# Patient Record
Sex: Female | Born: 1960 | Race: White | Hispanic: No | Marital: Married | Smoking: Former smoker
Health system: Southern US, Community
[De-identification: ages and names within clinical notes are randomized; demographics above are authoritative.]

## PROBLEM LIST (undated history)

## (undated) DIAGNOSIS — F419 Anxiety disorder, unspecified: Secondary | ICD-10-CM

## (undated) DIAGNOSIS — T7840XA Allergy, unspecified, initial encounter: Secondary | ICD-10-CM

## (undated) DIAGNOSIS — N201 Calculus of ureter: Secondary | ICD-10-CM

## (undated) DIAGNOSIS — Z87442 Personal history of urinary calculi: Secondary | ICD-10-CM

## (undated) DIAGNOSIS — F32A Depression, unspecified: Secondary | ICD-10-CM

## (undated) DIAGNOSIS — F329 Major depressive disorder, single episode, unspecified: Secondary | ICD-10-CM

## (undated) DIAGNOSIS — IMO0002 Reserved for concepts with insufficient information to code with codable children: Secondary | ICD-10-CM

## (undated) HISTORY — DX: Reserved for concepts with insufficient information to code with codable children: IMO0002

## (undated) HISTORY — PX: TUBAL LIGATION: SHX77

## (undated) HISTORY — PX: ABDOMINAL HYSTERECTOMY: SHX81

## (undated) HISTORY — DX: Allergy, unspecified, initial encounter: T78.40XA

## (undated) HISTORY — PX: CHOLECYSTECTOMY: SHX55

## (undated) HISTORY — DX: Calculus of ureter: N20.1

---

## 1999-12-24 ENCOUNTER — Emergency Department (HOSPITAL_COMMUNITY): Admission: EM | Admit: 1999-12-24 | Discharge: 1999-12-24 | Payer: Self-pay | Admitting: Emergency Medicine

## 2001-09-13 ENCOUNTER — Other Ambulatory Visit: Admission: RE | Admit: 2001-09-13 | Discharge: 2001-09-13 | Payer: Self-pay | Admitting: Obstetrics and Gynecology

## 2003-01-26 ENCOUNTER — Other Ambulatory Visit: Admission: RE | Admit: 2003-01-26 | Discharge: 2003-01-26 | Payer: Self-pay | Admitting: Obstetrics and Gynecology

## 2003-05-01 ENCOUNTER — Encounter: Admission: RE | Admit: 2003-05-01 | Discharge: 2003-05-01 | Payer: Self-pay | Admitting: Family Medicine

## 2003-09-25 ENCOUNTER — Encounter: Admission: RE | Admit: 2003-09-25 | Discharge: 2003-09-25 | Payer: Self-pay | Admitting: Family Medicine

## 2004-05-28 ENCOUNTER — Encounter: Admission: RE | Admit: 2004-05-28 | Discharge: 2004-05-28 | Payer: Self-pay | Admitting: Family Medicine

## 2004-07-17 ENCOUNTER — Other Ambulatory Visit: Admission: RE | Admit: 2004-07-17 | Discharge: 2004-07-17 | Payer: Self-pay | Admitting: Obstetrics and Gynecology

## 2004-08-15 ENCOUNTER — Encounter (INDEPENDENT_AMBULATORY_CARE_PROVIDER_SITE_OTHER): Payer: Self-pay | Admitting: Specialist

## 2004-08-15 ENCOUNTER — Observation Stay (HOSPITAL_COMMUNITY): Admission: RE | Admit: 2004-08-15 | Discharge: 2004-08-16 | Payer: Self-pay | Admitting: Obstetrics and Gynecology

## 2008-10-23 ENCOUNTER — Encounter: Admission: RE | Admit: 2008-10-23 | Discharge: 2008-10-23 | Payer: Self-pay | Admitting: Emergency Medicine

## 2009-06-06 ENCOUNTER — Observation Stay (HOSPITAL_COMMUNITY): Admission: EM | Admit: 2009-06-06 | Discharge: 2009-06-10 | Payer: Self-pay | Admitting: Emergency Medicine

## 2009-06-08 ENCOUNTER — Encounter (INDEPENDENT_AMBULATORY_CARE_PROVIDER_SITE_OTHER): Payer: Self-pay | Admitting: Internal Medicine

## 2010-04-11 ENCOUNTER — Encounter: Admission: RE | Admit: 2010-04-11 | Discharge: 2010-04-11 | Payer: Self-pay | Admitting: Internal Medicine

## 2010-04-23 ENCOUNTER — Ambulatory Visit (HOSPITAL_COMMUNITY): Admission: RE | Admit: 2010-04-23 | Discharge: 2010-04-24 | Payer: Self-pay | Admitting: General Surgery

## 2010-04-23 ENCOUNTER — Encounter (INDEPENDENT_AMBULATORY_CARE_PROVIDER_SITE_OTHER): Payer: Self-pay | Admitting: General Surgery

## 2010-07-20 ENCOUNTER — Encounter: Payer: Self-pay | Admitting: Family Medicine

## 2010-09-11 LAB — CBC
HCT: 34.1 % — ABNORMAL LOW (ref 36.0–46.0)
HCT: 34.3 % — ABNORMAL LOW (ref 36.0–46.0)
HCT: 38.1 % (ref 36.0–46.0)
Hemoglobin: 11.2 g/dL — ABNORMAL LOW (ref 12.0–15.0)
Hemoglobin: 11.3 g/dL — ABNORMAL LOW (ref 12.0–15.0)
Hemoglobin: 12.5 g/dL (ref 12.0–15.0)
MCH: 28.7 pg (ref 26.0–34.0)
MCH: 29.1 pg (ref 26.0–34.0)
MCH: 29.5 pg (ref 26.0–34.0)
MCHC: 32.7 g/dL (ref 30.0–36.0)
MCHC: 32.8 g/dL (ref 30.0–36.0)
MCHC: 33.1 g/dL (ref 30.0–36.0)
MCV: 87.6 fL (ref 78.0–100.0)
MCV: 89 fL (ref 78.0–100.0)
MCV: 89.1 fL (ref 78.0–100.0)
Platelets: 241 10*3/uL (ref 150–400)
Platelets: 245 10*3/uL (ref 150–400)
Platelets: 328 10*3/uL (ref 150–400)
RBC: 3.83 MIL/uL — ABNORMAL LOW (ref 3.87–5.11)
RBC: 3.85 MIL/uL — ABNORMAL LOW (ref 3.87–5.11)
RBC: 4.35 MIL/uL (ref 3.87–5.11)
RDW: 14.4 % (ref 11.5–15.5)
RDW: 14.4 % (ref 11.5–15.5)
RDW: 14.5 % (ref 11.5–15.5)
WBC: 12.6 10*3/uL — ABNORMAL HIGH (ref 4.0–10.5)
WBC: 8.6 10*3/uL (ref 4.0–10.5)
WBC: 9.4 10*3/uL (ref 4.0–10.5)

## 2010-09-11 LAB — COMPREHENSIVE METABOLIC PANEL
ALT: 25 U/L (ref 0–35)
ALT: 32 U/L (ref 0–35)
AST: 24 U/L (ref 0–37)
AST: 49 U/L — ABNORMAL HIGH (ref 0–37)
Albumin: 2.5 g/dL — ABNORMAL LOW (ref 3.5–5.2)
Albumin: 3.7 g/dL (ref 3.5–5.2)
Alkaline Phosphatase: 62 U/L (ref 39–117)
Alkaline Phosphatase: 80 U/L (ref 39–117)
BUN: 5 mg/dL — ABNORMAL LOW (ref 6–23)
BUN: 6 mg/dL (ref 6–23)
CO2: 23 mEq/L (ref 19–32)
CO2: 27 mEq/L (ref 19–32)
Calcium: 7.5 mg/dL — ABNORMAL LOW (ref 8.4–10.5)
Calcium: 9.4 mg/dL (ref 8.4–10.5)
Chloride: 101 mEq/L (ref 96–112)
Chloride: 102 mEq/L (ref 96–112)
Creatinine, Ser: 0.73 mg/dL (ref 0.4–1.2)
Creatinine, Ser: 0.84 mg/dL (ref 0.4–1.2)
GFR calc Af Amer: >60 mL/min (ref >60)
GFR calc Af Amer: >60 mL/min (ref >60)
GFR calc non Af Amer: >60 mL/min (ref >60)
GFR calc non Af Amer: >60 mL/min (ref >60)
Glucose, Bld: 103 mg/dL — ABNORMAL HIGH (ref 70–99)
Glucose, Bld: 92 mg/dL (ref 70–99)
Potassium: 3.6 mEq/L (ref 3.5–5.1)
Potassium: 4.6 mEq/L (ref 3.5–5.1)
Sodium: 130 mEq/L — ABNORMAL LOW (ref 135–145)
Sodium: 135 mEq/L (ref 135–145)
Total Bilirubin: 0.2 mg/dL — ABNORMAL LOW (ref 0.3–1.2)
Total Bilirubin: 0.7 mg/dL (ref 0.3–1.2)
Total Protein: 5.2 g/dL — ABNORMAL LOW (ref 6.0–8.3)
Total Protein: 6.8 g/dL (ref 6.0–8.3)

## 2010-09-11 LAB — HEPATIC FUNCTION PANEL
ALT: 52 U/L — ABNORMAL HIGH (ref 0–35)
AST: 89 U/L — ABNORMAL HIGH (ref 0–37)
Albumin: 2.5 g/dL — ABNORMAL LOW (ref 3.5–5.2)
Alkaline Phosphatase: 86 U/L (ref 39–117)
Bilirubin, Direct: 0.7 mg/dL — ABNORMAL HIGH (ref 0.0–0.3)
Indirect Bilirubin: 1 mg/dL — ABNORMAL HIGH (ref 0.3–0.9)
Total Bilirubin: 1.7 mg/dL — ABNORMAL HIGH (ref 0.3–1.2)
Total Protein: 5.2 g/dL — ABNORMAL LOW (ref 6.0–8.3)

## 2010-09-11 LAB — BASIC METABOLIC PANEL
BUN: 5 mg/dL — ABNORMAL LOW (ref 6–23)
CO2: 27 mEq/L (ref 19–32)
Calcium: 7.7 mg/dL — ABNORMAL LOW (ref 8.4–10.5)
Chloride: 105 mEq/L (ref 96–112)
Creatinine, Ser: 0.85 mg/dL (ref 0.4–1.2)
GFR calc Af Amer: >60 mL/min (ref >60)
GFR calc non Af Amer: >60 mL/min (ref >60)
Glucose, Bld: 97 mg/dL (ref 70–99)
Potassium: 3.9 mEq/L (ref 3.5–5.1)
Sodium: 136 mEq/L (ref 135–145)

## 2010-09-11 LAB — SURGICAL PCR SCREEN
MRSA, PCR: NEGATIVE
Staphylococcus aureus: NEGATIVE

## 2010-10-01 LAB — CBC
HCT: 30.8 % — ABNORMAL LOW (ref 36.0–46.0)
HCT: 44.5 % (ref 36.0–46.0)
Hemoglobin: 10.2 g/dL — ABNORMAL LOW (ref 12.0–15.0)
Hemoglobin: 14.9 g/dL (ref 12.0–15.0)
MCHC: 33 g/dL (ref 30.0–36.0)
MCHC: 33.2 g/dL (ref 30.0–36.0)
MCHC: 33.5 g/dL (ref 30.0–36.0)
MCV: 90.6 fL (ref 78.0–100.0)
MCV: 90.9 fL (ref 78.0–100.0)
MCV: 91.4 fL (ref 78.0–100.0)
Platelets: 339 10*3/uL (ref 150–400)
RBC: 3.37 MIL/uL — ABNORMAL LOW (ref 3.87–5.11)
RBC: 4.91 MIL/uL (ref 3.87–5.11)
RDW: 14.5 % (ref 11.5–15.5)
RDW: 14.7 % (ref 11.5–15.5)
WBC: 12.2 10*3/uL — ABNORMAL HIGH (ref 4.0–10.5)
WBC: 6 10*3/uL (ref 4.0–10.5)

## 2010-10-01 LAB — LIPASE, BLOOD: Lipase: 16 U/L (ref 11–59)

## 2010-10-01 LAB — DIFFERENTIAL
Basophils Absolute: 0.4 10*3/uL — ABNORMAL HIGH (ref 0.0–0.1)
Basophils Relative: 3 % — ABNORMAL HIGH (ref 0–1)
Eosinophils Absolute: 0.1 10*3/uL (ref 0.0–0.7)
Eosinophils Absolute: 0.2 10*3/uL (ref 0.0–0.7)
Eosinophils Relative: 1 % (ref 0–5)
Eosinophils Relative: 2 % (ref 0–5)
Lymphocytes Relative: 25 % (ref 12–46)
Lymphocytes Relative: 36 % (ref 12–46)
Lymphs Abs: 2.5 10*3/uL (ref 0.7–4.0)
Lymphs Abs: 3 10*3/uL (ref 0.7–4.0)
Monocytes Absolute: 0.6 10*3/uL (ref 0.1–1.0)
Monocytes Relative: 5 % (ref 3–12)
Monocytes Relative: 7 % (ref 3–12)
Neutro Abs: 8.1 10*3/uL — ABNORMAL HIGH (ref 1.7–7.7)
Neutrophils Relative %: 54 % (ref 43–77)
Neutrophils Relative %: 66 % (ref 43–77)

## 2010-10-01 LAB — URINALYSIS, ROUTINE W REFLEX MICROSCOPIC
Glucose, UA: NEGATIVE mg/dL
Ketones, ur: >80 mg/dL — AB
Leukocytes, UA: NEGATIVE
Nitrite: NEGATIVE
Protein, ur: NEGATIVE mg/dL
Specific Gravity, Urine: 1.026 (ref 1.005–1.030)
Urobilinogen, UA: 0.2 mg/dL (ref 0.0–1.0)
pH: 6 (ref 5.0–8.0)

## 2010-10-01 LAB — BASIC METABOLIC PANEL
BUN: <1 mg/dL — ABNORMAL LOW (ref 6–23)
CO2: 24 mEq/L (ref 19–32)
CO2: 28 mEq/L (ref 19–32)
Calcium: 8 mg/dL — ABNORMAL LOW (ref 8.4–10.5)
Chloride: 108 mEq/L (ref 96–112)
Chloride: 109 mEq/L (ref 96–112)
Creatinine, Ser: 0.63 mg/dL (ref 0.4–1.2)
GFR calc Af Amer: >60 mL/min (ref >60)
Glucose, Bld: 95 mg/dL (ref 70–99)
Potassium: 3.1 mEq/L — ABNORMAL LOW (ref 3.5–5.1)

## 2010-10-01 LAB — COMPREHENSIVE METABOLIC PANEL
ALT: 13 U/L (ref 0–35)
ALT: 18 U/L (ref 0–35)
AST: 15 U/L (ref 0–37)
AST: 22 U/L (ref 0–37)
Albumin: 3.8 g/dL (ref 3.5–5.2)
Alkaline Phosphatase: 66 U/L (ref 39–117)
BUN: 8 mg/dL (ref 6–23)
CO2: 24 mEq/L (ref 19–32)
CO2: 27 mEq/L (ref 19–32)
Calcium: 8.4 mg/dL (ref 8.4–10.5)
Calcium: 9 mg/dL (ref 8.4–10.5)
Chloride: 104 mEq/L (ref 96–112)
Creatinine, Ser: 0.69 mg/dL (ref 0.4–1.2)
Creatinine, Ser: 0.71 mg/dL (ref 0.4–1.2)
GFR calc Af Amer: >60 mL/min (ref >60)
GFR calc Af Amer: >60 mL/min (ref >60)
GFR calc non Af Amer: >60 mL/min (ref >60)
GFR calc non Af Amer: >60 mL/min (ref >60)
Glucose, Bld: 101 mg/dL — ABNORMAL HIGH (ref 70–99)
Potassium: 3.4 mEq/L — ABNORMAL LOW (ref 3.5–5.1)
Sodium: 135 mEq/L (ref 135–145)
Sodium: 136 mEq/L (ref 135–145)
Total Bilirubin: 0.6 mg/dL (ref 0.3–1.2)
Total Protein: 5.8 g/dL — ABNORMAL LOW (ref 6.0–8.3)
Total Protein: 7.5 g/dL (ref 6.0–8.3)

## 2010-10-01 LAB — GLUCOSE, CAPILLARY: Glucose-Capillary: 104 mg/dL — ABNORMAL HIGH (ref 70–99)

## 2010-10-01 LAB — URINE MICROSCOPIC-ADD ON

## 2010-10-01 LAB — MAGNESIUM: Magnesium: 2 mg/dL (ref 1.5–2.5)

## 2010-11-15 NOTE — Discharge Summary (Signed)
Karen Rhodes, Karen Rhodes NO.:  [id number]   MEDICAL RECORD NO.:  [id number]          PATIENT TYPE:  OBV   LOCATION:  9319                          FACILITY:  [facility]   PHYSICIAN:  Juluis Mire, M.D.   DATE OF BIRTH:  [birth year]   DATE OF ADMISSION:  08/15/2004  DATE OF DISCHARGE:                                 DISCHARGE SUMMARY   ADMITTING DIAGNOSIS:  Uterine adenomyosis, endometriosis.   DISCHARGE DIAGNOSIS:  Uterine adenomyosis, endometriosis.   OPERATIVE PROCEDURE:  Laparoscopic-assisted vaginal hysterectomy with left  salpingo-oophorectomy.   For complete history and physical please see dictated note.   COURSE IN THE HOSPITAL:  The patient underwent the above-noted surgery.  Pelvic endometriosis was noted. Final pathology is pending. Postoperatively,  her hemoglobin was 10.1. She was seen on her first postoperative day, did  have some abdominal pain and nausea that resolved with observation, was  discharged home later on her postoperative day #1. At that time she was  afebrile with stable vital signs. Abdomen was soft, bowel sounds were  active. Incision was clear. She had no active vaginal bleeding. She was  voiding without difficulty.   COMPLICATIONS:  None encountered during her stay in the hospital. The  patient was discharged home in stable condition.   DISPOSITION:  Routine postoperative instructions and orders given. She is to  avoid heavy lifting, vaginal entrance, or driving a car. Given Percocet as  needed for pain as well as Phenergan for nausea. The office will call her  and she will arrange follow-up in 1 week. She is to call with  nausea/vomiting, increasing abdominal pain, active bleeding, or signs of  fever.      JSM/MEDQ  D:  08/16/2004  T:  08/16/2004  Job:  [id number]

## 2010-11-15 NOTE — Op Note (Signed)
NAMEMARQUISA, SALIH NO.:  [id number]   MEDICAL RECORD NO.:  [id number]          PATIENT TYPE:  OBV   LOCATION:  9399                          [facility]ILITY[facility]  [facility]   PHYSICIAN:  Juluis Mire, M.D.   DATE OF BIRTH:  [birth year]   DATE OF PROCEDURE:  08/15/2004  DATE OF DISCHARGE:                                 OPERATIVE REPORT   PREOPERATIVE DIAGNOSIS:  Pelvic pain and menorrhagia secondary to uterine  adenomyosis and pelvic endometriosis.   POSTOPERATIVE DIAGNOSIS:  Pelvic pain and menorrhagia secondary to uterine  adenomyosis and pelvic endometriosis.   OPERATIVE PROCEDURE:  Laparoscopically-assisted vaginal hysterectomy with  left salpingo-oophorectomy.   SURGEON:  Juluis Mire, M.D.   ANESTHESIA:  General endotracheal.   ESTIMATED BLOOD LOSS:  200-400 mL.   PACKS AND DRAINS:  None.   INTRAOPERATIVE BLOOD REPLACED:  None.   COMPLICATIONS:  None.   INDICATIONS:  Dictated in the history and physical.   The procedure is as follows:  The patient was taken in the OR and placed in  the supine position.  After a satisfactory level of general endotracheal  anesthesia was obtained, the patient was placed in dorsal lithotomy position  using the Allen stirrups.  The abdomen, perineum and vagina were prepped out  with Betadine.  The bladder was emptied by in-and-out catheterization.  A  Hulka tenaculum was put in place and secured.  The patient was then draped  in a sterile field.  A subumbilical incision was made with the knife.  A  Veress needle was introduced into the abdominal cavity.  The abdomen was  insufflated with approximately 3 L of carbon dioxide. The operating  laparoscope was introduced.  There was no evidence of injury to adjacent  organs.  A 5 mm trocar was put into place in the suprapubic area under  direct visualization.  The uterus was upper limits of normal size.  The left  ovary did have endometriosis involving it.  There were no adhesions.   The  right tube and ovary were unremarkable.  There was endometriosis in the cul-  de-sac.  The adnexa were visualized and noted to be normal.  The upper  abdomen including the liver and tip of the gallbladder were clear.  Next,  using the Gyrus bipolar, the left ovary was elevated.  The ovarian  vasculature was identified.  The ureter was seen well down into the pelvis.  Next the left ovarian vasculature was cauterized, incised using the Gyrus.  The peritoneal attachment of the ovary and tube was cauterized and incised.  The left round ligament was cauterized and incised.  Next the right ovary  was elevated.  The ureter was identified.  The right utero-ovarian pedicle  was cauterized and incised and the right tube and mesosalpinx were  cauterized and incised, and the right round ligament was cauterized and  incised.  She did have some bleeding from the right ovary.  This was brought  under control, again using the Gyrus.  At this point in time we had good  hemostasis.  The decision  was to go vaginally.  The abdomen was deflated of  its carbon dioxide, laparoscope was removed.   The patient's legs were repositioned, the Hulka tenaculum was then removed.  A weighted speculum was placed in the vaginal vault.  Cervix grasped with a  Christella Hartigan tenaculum.  The cul-de-sac was entered sharply.  The uterosacral  ligaments were clamped, cut, and suture ligated with 0 Vicryl.  Reflection  of the vaginal mucosa anteriorly was incised and the bladder was dissected  superiorly.  The paracervical tissue was clamped, cut, and suture ligated  with 0 Vicryl.  The vesicouterine space was identified, entered, retractors  put in place to retract the bladder superiorly.  Using the clamp, cut and  tie technique with suture ligatures of 0 Vicryl, the parametrium was  serially separated from the sides of the uterus.  The uterus was then  flipped.  The remaining pedicles were clamped and cut.  The uterus and left   tube and ovary were passed off the operative field.  The held pedicles were  secured with free ties of 0 Vicryl.  Areas of bleeding along the vaginal  cuff were brought under control with figure-of-eights of 0 Vicryl.  The  vaginal mucosa was then reapproximated in a vertical fashion in the midline  with figure-of-eights of 0 Vicryl.  A Foley was placed to straight drain  with retrieval of an adequate amount of clear urine.  A sponge on a sponge  stick was placed in the vaginal vault.  The weighted speculum was then  removed.   The patient's legs were repositioned, the abdomen was reinflated with carbon  dioxide.  The laparoscope was then reintroduced.  Visualization revealed  some oozing at the vaginal cuff, brought under control using the Gyrus.  We  thoroughly irrigated the pelvis.  We had good hemostasis at all pedicles.  At this point in time the abdomen was deflated of its carbon dioxide, all  trocars removed.  The subumbilical incision closed with interrupted  subcuticulars of 4-0 Vicryl, the suprapubic incision was closed with  Dermabond, the sponge on a sponge stick was removed from the vaginal vault.  The patient was taken out of the dorsal lithotomy position and once alert  and extubated, transferred to the recovery room in good condition.  Sponge,  instrument and needle count reported as correct by the circulating nurse x2.  The Foley catheter remained clear at the time of closure.      JSM/MEDQ  D:  08/15/2004  T:  08/15/2004  Job:  [id number]

## 2010-11-15 NOTE — H&P (Signed)
NAMELARUTH, HANGER NO.:  [id number]   MEDICAL RECORD NO.:  [id number]          PATIENT TYPE:  AMB   LOCATION:  SDC                           [facility]:  [facility]   PHYSICIAN:  Juluis Mire, M.D.   DATE OF BIRTH:  05-29-1961   DATE OF ADMISSION:  DATE OF DISCHARGE:                                HISTORY & PHYSICAL   The patient is a 50 year old gravida 2, para 2, married white female who  presents for a laparoscopically-assisted vaginal hysterectomy with left  salpingo-oophorectomy.   In relation to the present admission, her cycles are regular at the present  time.  She has five to six days of flow, changing pads and tampons every two  hours with clots and increasing pain.  The pain is very prominent on the  left side.  She also has pain on the left side during intercourse during  deep penetration that radiates to the back at times.  She underwent an  ultrasound evaluation, which was highly suggestive of adenomyosis.  She did  have a left ovarian cyst that basically resolved on follow-up, therefore was  functional in nature.  Because of persistent pain, abnormal bleeding and  pain with intercourse, we have discussed options.  This would include  laparoscopic evaluation versus more aggressive therapy in the form of  laparoscopically-assisted vaginal hysterectomy and left salpingo-  oophorectomy.  The patient is in favor of this procedure and is admitted at  the present time.   In terms of allergies, the patient is allergic to CODEINE.   MEDICATIONS:  Cymbalta, Prozac and Ambien.   PAST MEDICAL HISTORY:  Usual childhood diseases without any significant  sequelae.  No previous surgical history except for bilateral tubal ligation  in 1996.   PAST OBSTETRICAL HISTORY:  She has had two vaginal deliveries.   FAMILY HISTORY:  Noncontributory.   SOCIAL HISTORY:  No tobacco or alcohol use.   REVIEW OF SYSTEMS:  Noncontributory.   PHYSICAL EXAMINATION:  VITAL  SIGNS:  The patient is afebrile with stable  vital signs.  HEENT:  The patient was normocephalic.  Pupils equal, round, and reactive to  light and accommodation.  Extraocular movements were intact.  Sclerae and  conjunctivae clear.  Oropharynx clear.  NECK:  Without thyromegaly.  BREASTS:  No discrete masses.  CHEST:  Lungs clear.  CARDIAC:  Regular rhythm and rate without murmurs or gallops.  ABDOMEN:  Benign, no masses, organomegaly or tenderness.  PELVIC:  Normal external genitalia.  Vaginal mucosa clear.  Cervix  unremarkable.  Uterus upper limits of normal size consistent with  adenomyosis.  Adnexa unremarkable.  Rectovaginal exam is clear.  EXTREMITIES:  Trace edema.  NEUROLOGIC:  Grossly within normal limits.   IMPRESSION:  Menorrhagia and dysmenorrhea secondary to uterine adenomyosis  versus endometriosis.   PLAN:  At the present time the patient has chosen to undergo  laparoscopically-assisted vaginal hysterectomy with left salpingo-  oophorectomy.  The right ovary will be left in place.  We have discussed  other options, including the possibility of laparoscopy, hysteroscopy and  endometrial ablation.  We have also discussed the use of the Mirena IUD.  We  also discussed the use of birth control pills.  Again, the patient has  chosen hysterectomy.  The risks of surgery have been discussed, including  the risks of infection; the risk of hemorrhage that could require  transfusion and the risk of AIDS or hepatitis; the risk of injury to  adjacent organs including bladder, bowel or ureters that could require  further exploratory surgery; the risk of deep venous thrombosis and  pulmonary embolus.  The patient expressed an understanding of the  indications, risks and options.      JSM/MEDQ  D:  08/15/2004  T:  08/15/2004  Job:  [id number]

## 2012-01-20 ENCOUNTER — Encounter (HOSPITAL_COMMUNITY): Payer: Self-pay | Admitting: *Deleted

## 2012-01-20 ENCOUNTER — Emergency Department (HOSPITAL_COMMUNITY)
Admission: EM | Admit: 2012-01-20 | Discharge: 2012-01-21 | Disposition: A | Discharge status: Home / Self Care | Payer: Self-pay | Attending: Emergency Medicine | Admitting: Emergency Medicine

## 2012-01-20 DIAGNOSIS — F329 Major depressive disorder, single episode, unspecified: Secondary | ICD-10-CM | POA: Insufficient documentation

## 2012-01-20 DIAGNOSIS — Z9089 Acquired absence of other organs: Secondary | ICD-10-CM | POA: Insufficient documentation

## 2012-01-20 DIAGNOSIS — F17200 Nicotine dependence, unspecified, uncomplicated: Secondary | ICD-10-CM | POA: Insufficient documentation

## 2012-01-20 DIAGNOSIS — F411 Generalized anxiety disorder: Secondary | ICD-10-CM | POA: Insufficient documentation

## 2012-01-20 DIAGNOSIS — F3289 Other specified depressive episodes: Secondary | ICD-10-CM | POA: Insufficient documentation

## 2012-01-20 HISTORY — DX: Depression, unspecified: F32.A

## 2012-01-20 HISTORY — DX: Major depressive disorder, single episode, unspecified: F32.9

## 2012-01-20 HISTORY — DX: Anxiety disorder, unspecified: F41.9

## 2012-01-20 LAB — BASIC METABOLIC PANEL
Calcium: 9.3 mg/dL (ref 8.4–10.5)
GFR calc Af Amer: >90 mL/min (ref >90)
GFR calc non Af Amer: >90 mL/min (ref >90)
Glucose, Bld: 112 mg/dL — ABNORMAL HIGH (ref 70–99)
Sodium: 136 mEq/L (ref 135–145)

## 2012-01-20 LAB — CBC WITH DIFFERENTIAL/PLATELET
Basophils Relative: 1 % (ref 0–1)
Eosinophils Absolute: 0.2 10*3/uL (ref 0.0–0.7)
Eosinophils Relative: 2 % (ref 0–5)
Lymphs Abs: 3.4 10*3/uL (ref 0.7–4.0)
MCH: 29.3 pg (ref 26.0–34.0)
MCHC: 33.4 g/dL (ref 30.0–36.0)
MCV: 87.8 fL (ref 78.0–100.0)
Platelets: 314 10*3/uL (ref 150–400)
RBC: 4.43 MIL/uL (ref 3.87–5.11)
RDW: 13.8 % (ref 11.5–15.5)

## 2012-01-20 LAB — RAPID URINE DRUG SCREEN, HOSP PERFORMED
Barbiturates: NOT DETECTED
Opiates: NOT DETECTED
Tetrahydrocannabinol: NOT DETECTED

## 2012-01-20 MED ORDER — ONDANSETRON HCL 4 MG PO TABS: 4.0000 mg | ORAL_TABLET | Freq: Three times a day (TID) | ORAL | Status: DC | PRN

## 2012-01-20 MED ORDER — LORAZEPAM 1 MG PO TABS: 1.0000 mg | ORAL_TABLET | Freq: Three times a day (TID) | ORAL | Status: DC | PRN

## 2012-01-20 MED ORDER — ZOLPIDEM TARTRATE 5 MG PO TABS: 5.0000 mg | ORAL_TABLET | Freq: Every evening | ORAL | Status: DC | PRN

## 2012-01-20 MED ORDER — IBUPROFEN 600 MG PO TABS: 600.0000 mg | ORAL_TABLET | Freq: Three times a day (TID) | ORAL | Status: DC | PRN

## 2012-01-20 MED ORDER — ACETAMINOPHEN 325 MG PO TABS: 650.0000 mg | ORAL_TABLET | ORAL | Status: DC | PRN

## 2012-01-20 MED ORDER — NICOTINE 21 MG/24HR TD PT24
21.0000 mg | MEDICATED_PATCH | Freq: Every day | TRANSDERMAL | Status: DC
  Administered 2012-01-20: 21 mg via TRANSDERMAL
  Filled 2012-01-20: qty 1

## 2012-01-20 MED ORDER — ALUM & MAG HYDROXIDE-SIMETH 200-200-20 MG/5ML PO SUSP: 30.0000 mL | ORAL | Status: DC | PRN

## 2012-01-20 NOTE — ED Notes (Signed)
Pt's Husband: Federica Allport: [phone]

## 2012-01-20 NOTE — ED Notes (Signed)
ZOX:[id number]<AV> Expected date:01/20/12<BR> Expected time: 1:37 PM<BR> Means of arrival:Ambulance<BR> Comments:<BR> overdose

## 2012-01-20 NOTE — ED Notes (Signed)
MD with patience

## 2012-01-20 NOTE — ED Notes (Signed)
Pt items are as followed gray leggy pants and colored blouse and white underwear and black flip flop items are under  Nurses station, three rings and earring studs

## 2012-01-20 NOTE — ED Notes (Signed)
Family at bedside. Sitter at bedside.  

## 2012-01-20 NOTE — ED Provider Notes (Signed)
History     CSN: [id number]  Arrival date & time 01/20/12  1342   First MD Initiated Contact with Patient 01/20/12 1348      Chief Complaint  Patient presents with  . Ingestion    (Consider location/radiation/quality/duration/timing/severity/associated sxs/prior treatment) HPI... took lorazepam 1 mg tablets x5 this afternoon.  Patient is not truly suicidal although she is depressed. 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  Severity is moderate to severe. Nothing makes symptoms better or worse  Past Medical History  Diagnosis Date  . Depression   . Anxiety     Past Surgical History  Procedure Date  . Cholecystectomy   . Abdominal hysterectomy     History reviewed. No pertinent family history.  History  Substance Use Topics  . Smoking status: Current Everyday Smoker    Types: Cigarettes  . Smokeless tobacco: Not on file  . Alcohol Use: No    OB History    Grav Para Term Preterm Abortions TAB SAB Ect Mult Living                  Review of Systems  All other systems reviewed and are negative.    Allergies  Penicillins  Home Medications   Current Outpatient Rx  Name Route Sig Dispense Refill  . AMPHETAMINE-DEXTROAMPHETAMINE 20 MG PO TABS Oral Take 20 mg by mouth 3 (three) times daily.    Marland Kitchen VITAMIN D 1000 UNITS PO TABS Oral Take 1,000 Units by mouth daily.    . DULOXETINE HCL 30 MG PO CPEP Oral Take 30 mg by mouth 4 (four) times daily.    Marland Kitchen LORAZEPAM 1 MG PO TABS Oral Take 1 mg by mouth every 6 (six) hours as needed. anxiety      BP 121/71  Pulse 97  Temp 98.2 F (36.8 C) (Oral)  Resp 15  SpO2 96%  Physical Exam  Nursing note and vitals reviewed. Constitutional: She is oriented to person, place, and time. She appears well-developed and well-nourished.  HENT:  Head: Normocephalic and atraumatic.  Eyes: Conjunctivae and EOM are normal. Pupils are equal, round, and reactive to  light.  Neck: Normal range of motion. Neck supple.  Cardiovascular: Normal rate and regular rhythm.   Pulmonary/Chest: Effort normal and breath sounds normal.  Abdominal: Soft. Bowel sounds are normal.  Musculoskeletal: Normal range of motion.  Neurological: She is alert and oriented to person, place, and time.  Skin: Skin is warm and dry.  Psychiatric:       Flat affect, depressed    ED Course  Procedures (including critical care time)   Labs Reviewed  CBC WITH DIFFERENTIAL  BASIC METABOLIC PANEL  ETHANOL  URINE RAPID DRUG SCREEN (HOSP PERFORMED)   No results found.   No diagnosis found.    MDM  Patient is depressed. Nothing she is frankly suicidal. Behavioral health consult        Donnetta Hutching, MD 01/20/12 1440

## 2012-01-20 NOTE — ED Notes (Signed)
MD at bedside. 

## 2012-01-20 NOTE — ED Notes (Signed)
Family at bedside. 
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
## 2012-01-20 NOTE — ED Notes (Signed)
Patients family sts shes had a few suicidial attempts in the last few months, sts they have to take her meds from her at times

## 2012-01-20 NOTE — ED Notes (Addendum)
Per EMS pt in from home reports taking 5-1mg  ativan around 1245 and 1300 today. Pt was threatening suicide to EMS.

## 2012-01-20 NOTE — ED Notes (Signed)
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

## 2012-01-20 NOTE — ED Notes (Signed)
Pt states that she has been overwhelmed with a series of life events that she cannot bear.  She said that she "just can't catch a break."  Also said that she texted her sister about not being to "handle it" and wanted to "end it."  Pt states that she and sister normally joke about this but today her sister knew she was not joking and called.  Pt reports taking 5 -1mg  Ativan and just wanted to go to sleep.  Pt noted to be tearful while giving this account.

## 2012-01-20 NOTE — ED Notes (Signed)
Pt belonging bag at nursing station

## 2012-01-21 NOTE — ED Notes (Signed)
ACT team at bedside.  
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
## 2012-01-22 ENCOUNTER — Telehealth (HOSPITAL_COMMUNITY): Payer: Self-pay | Admitting: Licensed Clinical Social Worker

## 2012-07-10 ENCOUNTER — Ambulatory Visit: Payer: Self-pay | Admitting: Family Medicine

## 2012-07-10 VITALS — BP 112/71 | HR 79 | Temp 97.5°F | Resp 18 | Ht 65.0 in | Wt 151.8 lb

## 2012-07-10 DIAGNOSIS — M545 Low back pain, unspecified: Secondary | ICD-10-CM | POA: Insufficient documentation

## 2012-07-10 DIAGNOSIS — M549 Dorsalgia, unspecified: Secondary | ICD-10-CM

## 2012-07-10 DIAGNOSIS — Z8742 Personal history of other diseases of the female genital tract: Secondary | ICD-10-CM | POA: Insufficient documentation

## 2012-07-10 MED ORDER — OXYCODONE-ACETAMINOPHEN 5-325 MG PO TABS: 1.0000 | ORAL_TABLET | Freq: Three times a day (TID) | ORAL | Status: DC | PRN

## 2012-07-10 MED ORDER — METHYLPREDNISOLONE ACETATE 80 MG/ML IJ SUSP
80.0000 mg | Freq: Once | INTRAMUSCULAR | Status: AC
  Administered 2012-07-10: 80 mg via INTRAMUSCULAR

## 2012-07-10 MED ORDER — CYCLOBENZAPRINE HCL 5 MG PO TABS: 5.0000 mg | ORAL_TABLET | Freq: Every evening | ORAL | Status: DC | PRN

## 2012-07-10 NOTE — Progress Notes (Signed)
This is a 52 y.o.female who has chronic intermittent back pain, most recently over a year ago.  This time the pain has been worsening over a week and is radiating down both legs.   Patient has a past history of low back pain for which she taken pain medicine.  Karen Rhodes denies any urinary symptoms, bowel problems, numbness in the legs, loss of motor power. Karen Rhodes had no fever.  Karen Rhodes has tried tylenol She had to leave work early today.  The pain is associated with nausea. She has used lidocaine patches in the past, but ran out of them  Past Medical History  Diagnosis Date  . Depression   . Anxiety   . Allergy   . Ulcer      Past Surgical History  Procedure Date  . Cholecystectomy   . Abdominal hysterectomy   . Tubal ligation     Objective:  middle-aged female in no acute distress. Blood pressure 112/71, pulse 79, temperature 97.5 F (36.4 C), temperature source Oral, resp. rate 18, height 5\' 5"  (1.651 m), weight 151 lb 12.8 oz (68.856 kg), SpO2 99.00%.Body mass index is 25.26 kg/(m^2). Palpation of the back reveals left lower paraspinal tenderness  Inspection of the back: Reveals nl scoliosis  Straight-leg raising: positive at 45 degrees bilaterally  Motor exam of lower extremity: No abnormal weakness.  Reflexes: Symmetric and normal  Skin exam: normal  Assessment/Plan: Acute lower back pain without acute neurological findings consistent with swollen disc  Plan:     1. Back pain  methylPREDNISolone acetate (DEPO-MEDROL) injection 80 mg, oxyCODONE-acetaminophen (ROXICET) 5-325 MG per tablet, cyclobenzaprine (FLEXERIL) 5 MG tablet  2. H/O endometritis    3. Low back pain

## 2012-07-10 NOTE — Patient Instructions (Addendum)
Sciatica Sciatica is pain, weakness, numbness, or tingling along the path of the sciatic nerve. The nerve starts in the lower back and runs down the back of each leg. The nerve controls the muscles in the lower leg and in the back of the knee, while also providing sensation to the back of the thigh, lower leg, and the sole of your foot. Sciatica is a symptom of another medical condition. For instance, nerve damage or certain conditions, such as a herniated disk or bone spur on the spine, pinch or put pressure on the sciatic nerve. This causes the pain, weakness, or other sensations normally associated with sciatica. Generally, sciatica only affects one side of the body. CAUSES   Herniated or slipped disc.  Degenerative disk disease.  A pain disorder involving the narrow muscle in the buttocks (piriformis syndrome).  Pelvic injury or fracture.  Pregnancy.  Tumor (rare). SYMPTOMS  Symptoms can vary from mild to very severe. The symptoms usually travel from the low back to the buttocks and down the back of the leg. Symptoms can include:  Mild tingling or dull aches in the lower back, leg, or hip.  Numbness in the back of the calf or sole of the foot.  Burning sensations in the lower back, leg, or hip.  Sharp pains in the lower back, leg, or hip.  Leg weakness.  Severe back pain inhibiting movement. These symptoms may get worse with coughing, sneezing, laughing, or prolonged sitting or standing. Also, being overweight may worsen symptoms. DIAGNOSIS  Your caregiver will perform a physical exam to look for common symptoms of sciatica. He or she may ask you to do certain movements or activities that would trigger sciatic nerve pain. Other tests may be performed to find the cause of the sciatica. These may include:  Blood tests.  X-rays.  Imaging tests, such as an MRI or CT scan. TREATMENT  Treatment is directed at the cause of the sciatic pain. Sometimes, treatment is not necessary  and the pain and discomfort goes away on its own. If treatment is needed, your caregiver may suggest:  Over-the-counter medicines to relieve pain.  Prescription medicines, such as anti-inflammatory medicine, muscle relaxants, or narcotics.  Applying heat or ice to the painful area.  Steroid injections to lessen pain, irritation, and inflammation around the nerve.  Reducing activity during periods of pain.  Exercising and stretching to strengthen your abdomen and improve flexibility of your spine. Your caregiver may suggest losing weight if the extra weight makes the back pain worse.  Physical therapy.  Surgery to eliminate what is pressing or pinching the nerve, such as a bone spur or part of a herniated disk. HOME CARE INSTRUCTIONS   Only take over-the-counter or prescription medicines for pain or discomfort as directed by your caregiver.  Apply ice to the affected area for 20 minutes, 3 4 times a day for the first 48 72 hours. Then try heat in the same way.  Exercise, stretch, or perform your usual activities if these do not aggravate your pain.  Attend physical therapy sessions as directed by your caregiver.  Keep all follow-up appointments as directed by your caregiver.  Do not wear high heels or shoes that do not provide proper support.  Check your mattress to see if it is too soft. A firm mattress may lessen your pain and discomfort. SEEK IMMEDIATE MEDICAL CARE IF:   You lose control of your bowel or bladder (incontinence).  You have increasing weakness in the lower back,   pelvis, buttocks, or legs.  You have redness or swelling of your back.  You have a burning sensation when you urinate.  You have pain that gets worse when you lie down or awakens you at night.  Your pain is worse than you have experienced in the past.  Your pain is lasting longer than 4 weeks.  You are suddenly losing weight without reason. MAKE SURE YOU:  Understand these  instructions.  Will watch your condition.  Will get help right away if you are not doing well or get worse. Document Released: 06/10/2001 Document Revised: 12/16/2011 Document Reviewed: 10/26/2011 Exit[employer] Patient Information 2013 [employer].  

## 2012-07-11 ENCOUNTER — Emergency Department (HOSPITAL_COMMUNITY)
Admission: EM | Admit: 2012-07-11 | Discharge: 2012-07-11 | Disposition: A | Discharge status: Home / Self Care | Payer: Self-pay | Attending: Emergency Medicine | Admitting: Emergency Medicine

## 2012-07-11 ENCOUNTER — Encounter (HOSPITAL_COMMUNITY): Payer: Self-pay | Admitting: Emergency Medicine

## 2012-07-11 DIAGNOSIS — Y939 Activity, unspecified: Secondary | ICD-10-CM | POA: Insufficient documentation

## 2012-07-11 DIAGNOSIS — S335XXA Sprain of ligaments of lumbar spine, initial encounter: Secondary | ICD-10-CM | POA: Insufficient documentation

## 2012-07-11 DIAGNOSIS — G8929 Other chronic pain: Secondary | ICD-10-CM | POA: Insufficient documentation

## 2012-07-11 DIAGNOSIS — F329 Major depressive disorder, single episode, unspecified: Secondary | ICD-10-CM | POA: Insufficient documentation

## 2012-07-11 DIAGNOSIS — F411 Generalized anxiety disorder: Secondary | ICD-10-CM | POA: Insufficient documentation

## 2012-07-11 DIAGNOSIS — Y929 Unspecified place or not applicable: Secondary | ICD-10-CM | POA: Insufficient documentation

## 2012-07-11 DIAGNOSIS — X58XXXA Exposure to other specified factors, initial encounter: Secondary | ICD-10-CM | POA: Insufficient documentation

## 2012-07-11 DIAGNOSIS — F17200 Nicotine dependence, unspecified, uncomplicated: Secondary | ICD-10-CM | POA: Insufficient documentation

## 2012-07-11 DIAGNOSIS — M5430 Sciatica, unspecified side: Secondary | ICD-10-CM | POA: Insufficient documentation

## 2012-07-11 DIAGNOSIS — S39012A Strain of muscle, fascia and tendon of lower back, initial encounter: Secondary | ICD-10-CM

## 2012-07-11 DIAGNOSIS — F3289 Other specified depressive episodes: Secondary | ICD-10-CM | POA: Insufficient documentation

## 2012-07-11 DIAGNOSIS — Z8719 Personal history of other diseases of the digestive system: Secondary | ICD-10-CM | POA: Insufficient documentation

## 2012-07-11 DIAGNOSIS — Z79899 Other long term (current) drug therapy: Secondary | ICD-10-CM | POA: Insufficient documentation

## 2012-07-11 DIAGNOSIS — Z9089 Acquired absence of other organs: Secondary | ICD-10-CM | POA: Insufficient documentation

## 2012-07-11 DIAGNOSIS — Z90710 Acquired absence of both cervix and uterus: Secondary | ICD-10-CM | POA: Insufficient documentation

## 2012-07-11 MED ORDER — OXYCODONE-ACETAMINOPHEN 5-325 MG PO TABS
1.0000 | ORAL_TABLET | Freq: Once | ORAL | Status: AC
  Administered 2012-07-11: 1 via ORAL
  Filled 2012-07-11: qty 1

## 2012-07-11 MED ORDER — DIAZEPAM 5 MG PO TABS
10.0000 mg | ORAL_TABLET | Freq: Once | ORAL | Status: AC
  Administered 2012-07-11: 10 mg via ORAL
  Filled 2012-07-11: qty 2

## 2012-07-11 MED ORDER — DIAZEPAM 5 MG PO TABS: 10.0000 mg | ORAL_TABLET | Freq: Three times a day (TID) | ORAL | Status: DC | PRN

## 2012-07-11 MED ORDER — ONDANSETRON HCL 4 MG/2ML IJ SOLN
4.0000 mg | Freq: Once | INTRAMUSCULAR | Status: AC
  Administered 2012-07-11: 4 mg via INTRAVENOUS
  Filled 2012-07-11: qty 2

## 2012-07-11 MED ORDER — FENTANYL CITRATE 0.05 MG/ML IJ SOLN
100.0000 ug | Freq: Once | INTRAMUSCULAR | Status: AC
  Administered 2012-07-11: 100 ug via INTRAVENOUS
  Filled 2012-07-11: qty 2

## 2012-07-11 MED ORDER — HYDROMORPHONE HCL PF 1 MG/ML IJ SOLN
0.5000 mg | Freq: Once | INTRAMUSCULAR | Status: AC
  Administered 2012-07-11: 14:00:00 via INTRAVENOUS
  Filled 2012-07-11: qty 1

## 2012-07-11 MED ORDER — HYDROMORPHONE HCL PF 1 MG/ML IJ SOLN
0.5000 mg | Freq: Once | INTRAMUSCULAR | Status: AC
  Administered 2012-07-11: 0.5 mg via INTRAVENOUS
  Filled 2012-07-11: qty 1

## 2012-07-11 NOTE — ED Notes (Signed)
Reports 30 years of back & sciatic pain. Seen @ Urgent Care yesterday treated with depo-medrol 80mg  IM instructed not to go to work. However, patient stated she had to go to work today. While @ work pain scale 10/10 now 8/10. On a good day pain scale is a 1/10. While working legs gave out during a spasm with her back.

## 2012-07-11 NOTE — ED Provider Notes (Addendum)
History     CSN: [id number]  Arrival date & time 07/11/12  1302   First MD Initiated Contact with Patient 07/11/12 1338      Chief Complaint  Patient presents with  . Back Pain    (Consider location/radiation/quality/duration/timing/severity/associated sxs/prior treatment) Patient is a 52 y.o. female presenting with back pain. The history is provided by the patient.  Back Pain  This is a recurrent problem. Episode onset: pt has had back pain for over 30 years but states over the last week started getting worse and saw her PCP yesterday and given meds however today at work got a spasm that caused her legs to buckle and she was unable to walk. The problem occurs constantly. The problem has been gradually worsening. The pain is associated with no known injury. The pain is present in the lumbar spine. The quality of the pain is described as stabbing and shooting. The pain radiates to the left thigh. The pain is at a severity of 10/10. The pain is severe. The symptoms are aggravated by bending, twisting and certain positions. The pain is the same all the time. Pertinent negatives include no numbness, no bowel incontinence, no bladder incontinence, no dysuria, no pelvic pain, no leg pain, no paresthesias, no paresis, no tingling and no weakness. She has tried NSAIDs, analgesics and muscle relaxants for the symptoms. The treatment provided no relief.    Past Medical History  Diagnosis Date  . Depression   . Anxiety   . Allergy   . Ulcer     Past Surgical History  Procedure Date  . Cholecystectomy   . Abdominal hysterectomy   . Tubal ligation     Family History  Problem Relation Age of Onset  . Heart disease Mother   . Cancer Mother   . Depression Sister   . Heart disease Maternal Grandmother   . Dementia Paternal Grandmother     History  Substance Use Topics  . Smoking status: Current Every Day Smoker    Types: Cigarettes  . Smokeless tobacco: Not on file  . Alcohol Use: No      OB History    Grav Para Term Preterm Abortions TAB SAB Ect Mult Living                  Review of Systems  Gastrointestinal: Negative for bowel incontinence.  Genitourinary: Negative for bladder incontinence, dysuria and pelvic pain.  Musculoskeletal: Positive for back pain.  Neurological: Negative for tingling, weakness, numbness and paresthesias.  All other systems reviewed and are negative.    Allergies  Asa and Penicillins  Home Medications   Current Outpatient Rx  Name  Route  Sig  Dispense  Refill  . AMPHETAMINE-DEXTROAMPHETAMINE 20 MG PO TABS   Oral   Take 20 mg by mouth 3 (three) times daily.         Marland Kitchen VITAMIN D 1000 UNITS PO TABS   Oral   Take 1,000 Units by mouth daily.         . CYCLOBENZAPRINE HCL 5 MG PO TABS   Oral   Take 1 tablet (5 mg total) by mouth at bedtime as needed for muscle spasms.   30 tablet   1   . DULOXETINE HCL 30 MG PO CPEP   Oral   Take 30 mg by mouth 3 (three) times daily.          Marland Kitchen LORAZEPAM 1 MG PO TABS   Oral   Take 1 mg by  mouth every 6 (six) hours as needed. anxiety         . OXYCODONE-ACETAMINOPHEN 5-325 MG PO TABS   Oral   Take 0.5 tablets by mouth every 8 (eight) hours as needed.         Marland Kitchen ZOLPIDEM TARTRATE 10 MG PO TABS   Oral   Take 5 mg by mouth at bedtime as needed. sleep           BP 90/50  Pulse 71  Temp 97.7 F (36.5 C) (Oral)  Resp 20  SpO2 97%  Physical Exam  Nursing note and vitals reviewed. Constitutional: She is oriented to person, place, and time. She appears well-developed and well-nourished. She appears distressed.  HENT:  Head: Normocephalic and atraumatic.  Mouth/Throat: Oropharynx is clear and moist.  Eyes: EOM are normal. Pupils are equal, round, and reactive to light.  Neck: Normal range of motion. Neck supple. No spinous process tenderness and no muscular tenderness present. No rigidity. Normal range of motion present.  Cardiovascular: Normal rate, regular rhythm,  normal heart sounds and intact distal pulses.   No murmur heard. Pulmonary/Chest: Effort normal and breath sounds normal. She has no wheezes. She has no rales.  Abdominal: Soft. She exhibits no distension. There is no tenderness. There is no CVA tenderness.  Musculoskeletal: She exhibits no tenderness.       Lumbar back: She exhibits decreased range of motion, tenderness, bony tenderness, pain and spasm. She exhibits no swelling, no deformity and normal pulse.  Neurological: She is alert and oriented to person, place, and time. Coordination normal.  Reflex Scores:      Patellar reflexes are 1+ on the right side and 1+ on the left side. Skin: Skin is warm and dry. No rash noted.  Psychiatric: She has a normal mood and affect.    ED Course  Procedures (including critical care time)  Labs Reviewed - No data to display No results found.   No diagnosis found.    MDM   Pt with gradual onset of back pain suggestive of radiculopathy.  No neurovascular compromise and no incontinence.  Pt has no infectious sx, hx of CA  or other red flags concerning for pathologic back pain.  Pt is able to ambulate but is painful.  Normal strength and reflexes on exam.  Denies trauma.  Pt saw PCP yesterday and got percocet and flexeril which she did not take percocet today before work but then got a bad spasm and work and now having severe pain which initially improved with fentanyl given by EMS. Will give pt pain control and valium and recheck.   3:16 PM Pt still having pain after 1mg  of dilaudid and 10 of valium.  Will give IV fentanyl as she felt that helped more.  3:47 PM Pt feeling much better and will d/ chome.   Gwyneth Sprout, MD 07/11/12 1517  Gwyneth Sprout, MD 07/11/12 1547

## 2012-07-11 NOTE — ED Notes (Signed)
Pt arrives by [facility] reports back pain, was recently seen and diagnosed with sciatica. Pt was prescribed flexeril and oxycodone for pain. Reports going to work today and turned wrong way and "back locked up."

## 2012-07-11 NOTE — ED Notes (Signed)
Bed:WHALA<BR> Expected date:<BR> Expected time:12:52 PM<BR> Means of arrival:Ambulance<BR> Comments:<BR> 51yof-back pain/recent diagnosis sciatica

## 2013-05-19 ENCOUNTER — Ambulatory Visit: Payer: Self-pay | Admitting: Family Medicine

## 2013-05-19 VITALS — BP 110/66 | HR 74 | Temp 98.3°F | Resp 16 | Ht 64.5 in | Wt 146.6 lb

## 2013-05-19 DIAGNOSIS — M25511 Pain in right shoulder: Secondary | ICD-10-CM

## 2013-05-19 DIAGNOSIS — M7501 Adhesive capsulitis of right shoulder: Secondary | ICD-10-CM

## 2013-05-19 DIAGNOSIS — M7500 Adhesive capsulitis of unspecified shoulder: Secondary | ICD-10-CM

## 2013-05-19 DIAGNOSIS — M25519 Pain in unspecified shoulder: Secondary | ICD-10-CM

## 2013-05-19 MED ORDER — METHOCARBAMOL 750 MG PO TABS: ORAL_TABLET | ORAL | Status: DC

## 2013-05-19 MED ORDER — MELOXICAM 7.5 MG PO TABS: ORAL_TABLET | ORAL | Status: DC

## 2013-05-19 NOTE — Progress Notes (Signed)
Subjective: 52 year old lady who has been hurting for about 4 months sooner right shoulder. She knows of no specific injury. He just started hurting and has gradually gotten worse. She works at [employer] and has had more and more pain. She's not been able to do much with that right arm over the last week, even measuring fiber is hard for her. She is right-handed. She cannot raise her right arm up. She has not been able to put her hair and a ponytail.  Sometime in the past she says she had problems with pain in the front part of her shoulder, with something involving her collarbone and neck.  Objective: Holes in right arm very cautiously. Right hand appears a little bit larger diameter in the wrist than the left. Her grip is good. Although seems normal the cisterns little bit down to it. Upper arm is satisfactory but she's very tender in the right shoulder girdle, especially anteriorly. It hurts to rotate internal or external anal. She is tender in the trapezius down to the shoulder. The neck has satisfactory range of motion although there was a cough when it was being moved around. Her throat is tender as noted already.  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 She is reluctant to do so due to the cost and she does not have insurance.  Patient decided she could not afford the extra at this time. I will treat her with medications, but told her that stretching of the shoulder is essentially gave her exercises.  Ultimately she will probably at least need a cortisone injection in the shoulder and/or physical therapy orthopedic referral. However an x-ray should be done before he those referrals.

## 2013-05-19 NOTE — Patient Instructions (Signed)

## 2013-07-18 ENCOUNTER — Ambulatory Visit: Payer: BC Managed Care – PPO

## 2013-07-18 ENCOUNTER — Ambulatory Visit: Payer: BC Managed Care – PPO | Admitting: Family Medicine

## 2013-07-18 VITALS — BP 100/60 | HR 82 | Temp 98.0°F | Resp 16 | Ht 64.0 in | Wt 147.0 lb

## 2013-07-18 DIAGNOSIS — M25519 Pain in unspecified shoulder: Secondary | ICD-10-CM

## 2013-07-18 DIAGNOSIS — M549 Dorsalgia, unspecified: Secondary | ICD-10-CM

## 2013-07-18 MED ORDER — CYCLOBENZAPRINE HCL 5 MG PO TABS: 5.0000 mg | ORAL_TABLET | Freq: Every evening | ORAL | Status: DC | PRN

## 2013-07-18 MED ORDER — OXYCODONE-ACETAMINOPHEN 5-325 MG PO TABS: 1.0000 | ORAL_TABLET | Freq: Three times a day (TID) | ORAL | Status: DC | PRN

## 2013-07-18 MED ORDER — METHYLPREDNISOLONE ACETATE 80 MG/ML IJ SUSP: 80.0000 mg | Freq: Once | INTRAMUSCULAR | Status: DC

## 2013-07-18 NOTE — Progress Notes (Signed)
   Subjective:    Patient ID: Karen Rhodes, female    DOB: December 26, 1960, 53 y.o.   MRN: [id number]  HPI  53 YO female patient comes in today with complaints of right arm and right shoulder pain for 6 months, at first intermittently but now constant for the last 2 months. She has not been able to get her arm above her head. She has had this problem for the past few months but did not have insurance to get treatment.  She is concerned that it was frozen shoulder syndrome. Her pain shoots down to her hand and into her mid back. Pt states her arm feels heavy to her. Some of the pain has started to radiate to the left side. She states that pain has increased in the evening.   She has tried Percocets, Ibuprofen, Lido patches. She has taken many pain killers and has not found any relief.   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 She sometimes has to lift heavy boxes.    Review of Systems     Objective:   Physical Exam No acute distress Patient moves her right arm very little Patient has some tenderness at the base of the right cervical spine (adjacent to C7) her reflexes are normal and her sensory exam is normal Patient is able to get her arm over her head when she bends over all of this is painful. She is unable to abduct greater than 80. There is no point tenderness  [facility] reading (PRIMARY) by  Dr. Milus GlazierLauenstein right shoulder. Slight irregularity of joint line of glenoid   After informed Consent, right anterior shoulder was injected with 1 cc of Marcaine and a half cc of Depo-Medrol 80 mg per mL. There were no complications and patient tolerated the procedure well. She did have significant pain relief following the procedure.      Assessment & Plan:  Pain in joint, shoulder region - Plan: DG Shoulder Right, Ambulatory referral to Orthopedic Surgery, methylPREDNISolone acetate (DEPO-MEDROL) injection 80 mg, oxyCODONE-acetaminophen (PERCOCET/ROXICET) 5-325 MG per tablet, cyclobenzaprine  (FLEXERIL) 5 MG tablet  Back pain - Plan: cyclobenzaprine (FLEXERIL) 5 MG tablet  Signed, Elvina SidleKurt Lauenstein, MD

## 2013-07-20 ENCOUNTER — Telehealth: Payer: Self-pay

## 2013-07-20 NOTE — Telephone Encounter (Signed)
PT WOULD LIKE TO KNOW IF WE WOULD FAX HER OV NOTES TO HER JOB SO SHE CAN GET MORE HOURS. PLEASE CALL PT AT [phone] AND THE FAX IS [phone]

## 2013-07-25 NOTE — Telephone Encounter (Signed)
Patient needs to fill out release of information to clarify and sign.

## 2019-05-03 ENCOUNTER — Encounter (HOSPITAL_COMMUNITY): Payer: Self-pay

## 2019-05-03 ENCOUNTER — Other Ambulatory Visit: Payer: Self-pay

## 2019-05-03 ENCOUNTER — Emergency Department (HOSPITAL_COMMUNITY)
Admission: EM | Admit: 2019-05-03 | Discharge: 2019-05-03 | Disposition: A | Discharge status: Home / Self Care | Payer: Self-pay | Attending: Emergency Medicine | Admitting: Emergency Medicine

## 2019-05-03 ENCOUNTER — Emergency Department (HOSPITAL_COMMUNITY): Payer: Self-pay

## 2019-05-03 DIAGNOSIS — Z5321 Procedure and treatment not carried out due to patient leaving prior to being seen by health care provider: Secondary | ICD-10-CM | POA: Insufficient documentation

## 2019-05-03 DIAGNOSIS — R079 Chest pain, unspecified: Secondary | ICD-10-CM | POA: Insufficient documentation

## 2019-05-03 LAB — CBC
HCT: 41.1 % (ref 36.0–46.0)
Hemoglobin: 13.2 g/dL (ref 12.0–15.0)
MCH: 29.3 pg (ref 26.0–34.0)
MCHC: 32.1 g/dL (ref 30.0–36.0)
MCV: 91.1 fL (ref 80.0–100.0)
Platelets: 353 10*3/uL (ref 150–400)
RBC: 4.51 MIL/uL (ref 3.87–5.11)
RDW: 13.7 % (ref 11.5–15.5)
WBC: 10.1 10*3/uL (ref 4.0–10.5)
nRBC: 0 % (ref 0.0–0.2)

## 2019-05-03 LAB — BASIC METABOLIC PANEL
Anion gap: 13 (ref 5–15)
BUN: 11 mg/dL (ref 6–20)
CO2: 24 mmol/L (ref 22–32)
Calcium: 9.6 mg/dL (ref 8.9–10.3)
Chloride: 102 mmol/L (ref 98–111)
Creatinine, Ser: 0.82 mg/dL (ref 0.44–1.00)
GFR calc Af Amer: >60 mL/min (ref >60)
GFR calc non Af Amer: >60 mL/min (ref >60)
Glucose, Bld: 97 mg/dL (ref 70–99)
Potassium: 3.6 mmol/L (ref 3.5–5.1)
Sodium: 139 mmol/L (ref 135–145)

## 2019-05-03 LAB — I-STAT BETA HCG BLOOD, ED (MC, WL, AP ONLY): I-stat hCG, quantitative: 7.6 m[IU]/mL — ABNORMAL HIGH (ref <5)

## 2019-05-03 LAB — TROPONIN I (HIGH SENSITIVITY): Troponin I (High Sensitivity): 2 ng/L (ref <18)

## 2019-05-03 MED ORDER — SODIUM CHLORIDE 0.9% FLUSH: 3.0000 mL | Freq: Once | INTRAVENOUS | Status: DC

## 2019-05-03 NOTE — ED Triage Notes (Signed)
Pt presents w/mid-sternum chest pain radiating to her Right shoulder blade x2 hours after eating cucumbers.

## 2019-09-16 ENCOUNTER — Encounter (HOSPITAL_COMMUNITY): Payer: Self-pay

## 2019-09-16 ENCOUNTER — Ambulatory Visit (HOSPITAL_COMMUNITY)
Admission: EM | Admit: 2019-09-16 | Discharge: 2019-09-16 | Disposition: A | Discharge status: Home / Self Care | Payer: BC Managed Care – PPO | Attending: Internal Medicine | Admitting: Internal Medicine

## 2019-09-16 ENCOUNTER — Other Ambulatory Visit: Payer: Self-pay

## 2019-09-16 DIAGNOSIS — Z79899 Other long term (current) drug therapy: Secondary | ICD-10-CM | POA: Insufficient documentation

## 2019-09-16 DIAGNOSIS — Z886 Allergy status to analgesic agent status: Secondary | ICD-10-CM | POA: Insufficient documentation

## 2019-09-16 DIAGNOSIS — K2970 Gastritis, unspecified, without bleeding: Secondary | ICD-10-CM | POA: Diagnosis not present

## 2019-09-16 DIAGNOSIS — F17210 Nicotine dependence, cigarettes, uncomplicated: Secondary | ICD-10-CM | POA: Diagnosis not present

## 2019-09-16 DIAGNOSIS — R197 Diarrhea, unspecified: Secondary | ICD-10-CM | POA: Diagnosis present

## 2019-09-16 DIAGNOSIS — F329 Major depressive disorder, single episode, unspecified: Secondary | ICD-10-CM | POA: Insufficient documentation

## 2019-09-16 DIAGNOSIS — Z20822 Contact with and (suspected) exposure to covid-19: Secondary | ICD-10-CM | POA: Insufficient documentation

## 2019-09-16 DIAGNOSIS — A084 Viral intestinal infection, unspecified: Secondary | ICD-10-CM | POA: Diagnosis not present

## 2019-09-16 DIAGNOSIS — F419 Anxiety disorder, unspecified: Secondary | ICD-10-CM | POA: Insufficient documentation

## 2019-09-16 DIAGNOSIS — Z880 Allergy status to penicillin: Secondary | ICD-10-CM | POA: Diagnosis not present

## 2019-09-16 DIAGNOSIS — Z8711 Personal history of peptic ulcer disease: Secondary | ICD-10-CM | POA: Diagnosis not present

## 2019-09-16 LAB — BASIC METABOLIC PANEL
Anion gap: 10 (ref 5–15)
BUN: 10 mg/dL (ref 6–20)
CO2: 25 mmol/L (ref 22–32)
Calcium: 9.4 mg/dL (ref 8.9–10.3)
Chloride: 104 mmol/L (ref 98–111)
Creatinine, Ser: 0.77 mg/dL (ref 0.44–1.00)
GFR calc Af Amer: >60 mL/min (ref >60)
GFR calc non Af Amer: >60 mL/min (ref >60)
Glucose, Bld: 117 mg/dL — ABNORMAL HIGH (ref 70–99)
Potassium: 5 mmol/L (ref 3.5–5.1)
Sodium: 139 mmol/L (ref 135–145)

## 2019-09-16 LAB — CBC
HCT: 42.8 % (ref 36.0–46.0)
Hemoglobin: 13.8 g/dL (ref 12.0–15.0)
MCH: 29.5 pg (ref 26.0–34.0)
MCHC: 32.2 g/dL (ref 30.0–36.0)
MCV: 91.5 fL (ref 80.0–100.0)
Platelets: 312 10*3/uL (ref 150–400)
RBC: 4.68 MIL/uL (ref 3.87–5.11)
RDW: 13.9 % (ref 11.5–15.5)
WBC: 7.9 10*3/uL (ref 4.0–10.5)
nRBC: 0 % (ref 0.0–0.2)

## 2019-09-16 MED ORDER — ONDANSETRON 4 MG PO TBDP: 4.0000 mg | ORAL_TABLET | Freq: Three times a day (TID) | ORAL | 0 refills | Status: DC | PRN

## 2019-09-16 MED ORDER — PANTOPRAZOLE SODIUM 20 MG PO TBEC: 20.0000 mg | DELAYED_RELEASE_TABLET | Freq: Two times a day (BID) | ORAL | 0 refills | Status: DC

## 2019-09-16 NOTE — ED Provider Notes (Signed)
[facility]    CSN: [id number] Arrival date & time: 09/16/19  7564      History   Chief Complaint Chief Complaint  Patient presents with  . Diarrhea    HPI Karen Rhodes is a 59 y.o. female comes to urgent care with complaints of nausea and diarrhea of 5 days duration.  Symptoms have been persistent.  It is associated with abdominal pain which is mainly in the epigastric area.  She denies association with food intake.  She has a history of gastric ulcers in the past but does not take proton pump inhibitors regularly.  Stools are brown.  No fever or chills.  No dizziness, near syncope or syncopal episode.  No sick contacts. HPI  Past Medical History:  Diagnosis Date  . Allergy   . Anxiety   . Depression   . Ulcer     Patient Active Problem List   Diagnosis Date Noted  . H/O endometritis 07/10/2012  . Low back pain 07/10/2012    Past Surgical History:  Procedure Laterality Date  . ABDOMINAL HYSTERECTOMY    . CHOLECYSTECTOMY    . TUBAL LIGATION      OB History   No obstetric history on file.      Home Medications    Prior to Admission medications   Medication Sig Start Date End Date Taking? Authorizing Provider  cholecalciferol (VITAMIN D) 1000 UNITS tablet Take 1,000 Units by mouth daily.    [provider]  cyclobenzaprine (FLEXERIL) 5 MG tablet Take 1 tablet (5 mg total) by mouth at bedtime as needed for muscle spasms. 07/18/13   Elvina Sidle, MD  diazepam (VALIUM) 5 MG tablet Take 2 tablets (10 mg total) by mouth every 8 (eight) hours as needed for anxiety. 07/11/12   Gwyneth Sprout, MD  DULoxetine (CYMBALTA) 30 MG capsule Take 30 mg by mouth 3 (three) times daily.     [provider]  FLUoxetine (PROZAC) 20 MG capsule Take 20 mg by mouth daily.    [provider]  LORazepam (ATIVAN) 1 MG tablet Take 1 mg by mouth every 6 (six) hours as needed. anxiety    [provider]  meloxicam (MOBIC) 7.5 MG tablet Take  one with breakfast for pain and inflammation and shoulder. If well tolerated increase to twice daily with food if needed 05/19/13   Peyton Najjar, MD  methocarbamol (ROBAXIN-750) 750 MG tablet Take one in the morning, one in the afternoon, and 2 at night for muscle relaxant 05/19/13   Peyton Najjar, MD  ondansetron (ZOFRAN ODT) 4 MG disintegrating tablet Take 1 tablet (4 mg total) by mouth every 8 (eight) hours as needed for nausea or vomiting. 09/16/19   Neleh Muldoon, Britta Mccreedy, MD  oxyCODONE-acetaminophen (PERCOCET/ROXICET) 5-325 MG per tablet Take 1 tablet by mouth every 8 (eight) hours as needed. 07/18/13   Elvina Sidle, MD  pantoprazole (PROTONIX) 20 MG tablet Take 1 tablet (20 mg total) by mouth 2 (two) times daily. 09/16/19 10/16/19  Merrilee Jansky, MD  zolpidem (AMBIEN) 10 MG tablet Take 5 mg by mouth at bedtime as needed. sleep    [provider]  amphetamine-dextroamphetamine (ADDERALL) 20 MG tablet Take 20 mg by mouth 3 (three) times daily.  09/16/19  [provider]    Family History Family History  Problem Relation Age of Onset  . Heart disease Mother   . Cancer Mother   . Depression Sister   . Heart disease Maternal Grandmother   .  Dementia Paternal Grandmother     Social History Social History   Tobacco Use  . Smoking status: Current Every Day Smoker    Types: Cigarettes  . Smokeless tobacco: Never Used  Substance Use Topics  . Alcohol use: No  . Drug use: No     Allergies   Asa [aspirin] and Penicillins   Review of Systems Review of Systems  Constitutional: Positive for activity change. Negative for chills and fever.  HENT: Negative.   Respiratory: Negative for cough and shortness of breath.   Cardiovascular: Negative for chest pain and palpitations.  Gastrointestinal: Positive for abdominal pain, diarrhea and nausea. Negative for vomiting.  Musculoskeletal: Negative for arthralgias, gait problem and myalgias.  Psychiatric/Behavioral:  Negative for confusion and decreased concentration.     Physical Exam Triage Vital Signs ED Triage Vitals  Enc Vitals Group     BP 09/16/19 0920 132/72     Pulse Rate 09/16/19 0920 85     Resp 09/16/19 0920 18     Temp 09/16/19 0920 98.5 F (36.9 C)     Temp Source 09/16/19 0920 Oral     SpO2 09/16/19 0920 98 %     Weight 09/16/19 0918 168 lb (76.2 kg)     Height --      Head Circumference --      Peak Flow --      Pain Score 09/16/19 0918 5     Pain Loc --      Pain Edu? --      Excl. in [place]? --    No data found.  Updated Vital Signs BP 132/72 (BP Location: Right Arm)   Pulse 85   Temp 98.5 F (36.9 C) (Oral)   Resp 18   Wt 76.2 kg   SpO2 98%   BMI 28.84 kg/m   Visual Acuity Right Eye Distance:   Left Eye Distance:   Bilateral Distance:    Right Eye Near:   Left Eye Near:    Bilateral Near:     Physical Exam Vitals and nursing note reviewed.  Constitutional:      Appearance: Normal appearance.  Cardiovascular:     Rate and Rhythm: Normal rate and regular rhythm.     Pulses: Normal pulses.     Heart sounds: Normal heart sounds.  Pulmonary:     Effort: Pulmonary effort is normal. No respiratory distress.     Breath sounds: Normal breath sounds. No wheezing or rhonchi.  Abdominal:     General: Bowel sounds are normal.     Tenderness: There is abdominal tenderness. There is no guarding or rebound.     Comments: Tenderness in the epigastric region.  Skin:    General: Skin is warm.     Capillary Refill: Capillary refill takes less than 2 seconds.     Coloration: Skin is not jaundiced.     Findings: No erythema.  Neurological:     General: No focal deficit present.     Mental Status: She is alert.      UC Treatments / Results  Labs (all labs ordered are listed, but only abnormal results are displayed) Labs Reviewed  BASIC METABOLIC PANEL - Abnormal; Notable for the following components:      Result Value   Glucose, Bld 117 (*)    All other  components within normal limits  SARS CORONAVIRUS 2 (TAT 6-24 HRS)  CBC    EKG   Radiology No results found.  Procedures Procedures (including critical care  time)  Medications Ordered in UC Medications - No data to display  Initial Impression / Assessment and Plan / UC Course  I have reviewed the triage vital signs and the nursing notes.  Pertinent labs & imaging results that were available during my care of the patient were reviewed by me and considered in my medical decision making (see chart for details).     1.  Viral gastroenteritis: Covid PCR test has been sent Patient is advised to self isolate until Covid testing results are available Zofran as needed for nausea/vomiting.  2.  Acute gastritis in the setting of gastric ulcer: Protonix 20 mg twice daily for 30 days Patient is encouraged to be compliant with medications If patient symptoms persist, she may benefit from GI evaluation. Final Clinical Impressions(s) / UC Diagnoses   Final diagnoses:  Viral gastroenteritis   Discharge Instructions   None    ED Prescriptions    Medication Sig Dispense Auth. Provider   pantoprazole (PROTONIX) 20 MG tablet Take 1 tablet (20 mg total) by mouth 2 (two) times daily. 60 tablet Chaney Ingram, Britta Mccreedy, MD   ondansetron (ZOFRAN ODT) 4 MG disintegrating tablet Take 1 tablet (4 mg total) by mouth every 8 (eight) hours as needed for nausea or vomiting. 20 tablet Dnasia Gauna, Britta Mccreedy, MD     PDMP not reviewed this encounter.   Merrilee Jansky, MD 09/17/19 1714

## 2019-09-16 NOTE — ED Triage Notes (Signed)
Pt state she has diarrhea and nausea x 5 days.

## 2019-09-17 LAB — SARS CORONAVIRUS 2 (TAT 6-24 HRS): SARS Coronavirus 2: NEGATIVE

## 2021-10-23 ENCOUNTER — Encounter (HOSPITAL_COMMUNITY): Payer: Self-pay

## 2021-10-23 ENCOUNTER — Other Ambulatory Visit: Payer: Self-pay

## 2021-10-23 ENCOUNTER — Emergency Department (HOSPITAL_COMMUNITY)
Admission: EM | Admit: 2021-10-23 | Discharge: 2021-10-23 | Disposition: A | Discharge status: Home / Self Care | Payer: BC Managed Care – PPO | Source: Home / Self Care | Attending: Emergency Medicine | Admitting: Emergency Medicine

## 2021-10-23 ENCOUNTER — Emergency Department (HOSPITAL_COMMUNITY): Payer: BC Managed Care – PPO

## 2021-10-23 DIAGNOSIS — D72829 Elevated white blood cell count, unspecified: Secondary | ICD-10-CM | POA: Insufficient documentation

## 2021-10-23 DIAGNOSIS — N200 Calculus of kidney: Secondary | ICD-10-CM

## 2021-10-23 DIAGNOSIS — N202 Calculus of kidney with calculus of ureter: Secondary | ICD-10-CM | POA: Insufficient documentation

## 2021-10-23 DIAGNOSIS — N9489 Other specified conditions associated with female genital organs and menstrual cycle: Secondary | ICD-10-CM | POA: Insufficient documentation

## 2021-10-23 DIAGNOSIS — R7309 Other abnormal glucose: Secondary | ICD-10-CM | POA: Insufficient documentation

## 2021-10-23 DIAGNOSIS — N23 Unspecified renal colic: Secondary | ICD-10-CM

## 2021-10-23 DIAGNOSIS — A419 Sepsis, unspecified organism: Secondary | ICD-10-CM | POA: Diagnosis not present

## 2021-10-23 DIAGNOSIS — E860 Dehydration: Secondary | ICD-10-CM | POA: Diagnosis not present

## 2021-10-23 LAB — CBC WITH DIFFERENTIAL/PLATELET
Abs Immature Granulocytes: 0.06 10*3/uL (ref 0.00–0.07)
Basophils Absolute: 0.1 10*3/uL (ref 0.0–0.1)
Basophils Relative: 1 %
Eosinophils Absolute: 0.2 10*3/uL (ref 0.0–0.5)
Eosinophils Relative: 1 %
HCT: 43.2 % (ref 36.0–46.0)
Hemoglobin: 14 g/dL (ref 12.0–15.0)
Immature Granulocytes: 1 %
Lymphocytes Relative: 39 %
Lymphs Abs: 4.4 10*3/uL — ABNORMAL HIGH (ref 0.7–4.0)
MCH: 29.9 pg (ref 26.0–34.0)
MCHC: 32.4 g/dL (ref 30.0–36.0)
MCV: 92.1 fL (ref 80.0–100.0)
Monocytes Absolute: 0.5 10*3/uL (ref 0.1–1.0)
Monocytes Relative: 4 %
Neutro Abs: 6.1 10*3/uL (ref 1.7–7.7)
Neutrophils Relative %: 54 %
Platelets: 402 10*3/uL — ABNORMAL HIGH (ref 150–400)
RBC: 4.69 MIL/uL (ref 3.87–5.11)
RDW: 13.7 % (ref 11.5–15.5)
WBC: 11.3 10*3/uL — ABNORMAL HIGH (ref 4.0–10.5)
nRBC: 0 % (ref 0.0–0.2)

## 2021-10-23 LAB — URINALYSIS, ROUTINE W REFLEX MICROSCOPIC
Bilirubin Urine: NEGATIVE
Glucose, UA: NEGATIVE mg/dL
Ketones, ur: 5 mg/dL — AB
Nitrite: NEGATIVE
Protein, ur: NEGATIVE mg/dL
Specific Gravity, Urine: 1.019 (ref 1.005–1.030)
pH: 7 (ref 5.0–8.0)

## 2021-10-23 LAB — COMPREHENSIVE METABOLIC PANEL
ALT: 13 U/L (ref 0–44)
AST: 15 U/L (ref 15–41)
Albumin: 3.9 g/dL (ref 3.5–5.0)
Alkaline Phosphatase: 73 U/L (ref 38–126)
Anion gap: 8 (ref 5–15)
BUN: 14 mg/dL (ref 6–20)
CO2: 26 mmol/L (ref 22–32)
Calcium: 9.1 mg/dL (ref 8.9–10.3)
Chloride: 103 mmol/L (ref 98–111)
Creatinine, Ser: 0.83 mg/dL (ref 0.44–1.00)
GFR, Estimated: >60 mL/min (ref >60)
Glucose, Bld: 133 mg/dL — ABNORMAL HIGH (ref 70–99)
Potassium: 3.4 mmol/L — ABNORMAL LOW (ref 3.5–5.1)
Sodium: 137 mmol/L (ref 135–145)
Total Bilirubin: 0.4 mg/dL (ref 0.3–1.2)
Total Protein: 7.7 g/dL (ref 6.5–8.1)

## 2021-10-23 LAB — HCG, QUANTITATIVE, PREGNANCY: hCG, Beta Chain, Quant, S: 9 m[IU]/mL — ABNORMAL HIGH (ref <5)

## 2021-10-23 LAB — LIPASE, BLOOD: Lipase: 32 U/L (ref 11–51)

## 2021-10-23 MED ORDER — HYDROMORPHONE HCL 1 MG/ML IJ SOLN
1.0000 mg | Freq: Once | INTRAMUSCULAR | Status: AC
  Administered 2021-10-23: 1 mg via INTRAVENOUS
  Filled 2021-10-23: qty 1

## 2021-10-23 MED ORDER — CELECOXIB 200 MG PO CAPS: 200.0000 mg | ORAL_CAPSULE | Freq: Two times a day (BID) | ORAL | 0 refills | Status: DC

## 2021-10-23 MED ORDER — OXYCODONE-ACETAMINOPHEN 5-325 MG PO TABS: 1.0000 | ORAL_TABLET | Freq: Four times a day (QID) | ORAL | 0 refills | Status: DC | PRN

## 2021-10-23 MED ORDER — ONDANSETRON HCL 4 MG/2ML IJ SOLN
4.0000 mg | Freq: Once | INTRAMUSCULAR | Status: AC
  Administered 2021-10-23: 4 mg via INTRAVENOUS
  Filled 2021-10-23: qty 2

## 2021-10-23 MED ORDER — ONDANSETRON HCL 4 MG PO TABS: 4.0000 mg | ORAL_TABLET | Freq: Three times a day (TID) | ORAL | 0 refills | Status: DC | PRN

## 2021-10-23 MED ORDER — KETOROLAC TROMETHAMINE 30 MG/ML IJ SOLN
30.0000 mg | Freq: Once | INTRAMUSCULAR | Status: AC
  Administered 2021-10-23: 30 mg via INTRAVENOUS
  Filled 2021-10-23: qty 1

## 2021-10-23 MED ORDER — TAMSULOSIN HCL 0.4 MG PO CAPS: 0.4000 mg | ORAL_CAPSULE | Freq: Two times a day (BID) | ORAL | 0 refills | Status: DC

## 2021-10-23 NOTE — ED Notes (Signed)
Pt transported to CT ?

## 2021-10-23 NOTE — Discharge Instructions (Addendum)
Return to the ED immediately if you develop fever, uncontrolled pain or vomiting, or other concerns. ° °

## 2021-10-23 NOTE — ED Provider Notes (Signed)
?[facility]-EMERGENCY DEPT ?Provider Note ? ? ?CSN: [id number] ?Arrival date & time: 10/23/21  0620 ? ?  ? ?History ? ?Chief Complaint  ?Patient presents with  ? Flank Pain  ? ? ?Karen Rhodes is a 61 y.o. female who presents emergency department with chief complaint of left flank pain.  She had onset of severe, constant, waxing and waning, sharp, stabbing left-sided abdominal and flank pain.  No aggravating or alleviating symptoms.  Patient unable to find a position of comfort.  She has never had anything like this before.  She denies urinary symptoms.  She has had a recent URI which she described as "like a sinus infection" over the past few days with associated chills, malaise and fatigue.  She denies any vomiting or other abdominal symptoms. ? ? ?Flank Pain ? ? ?  ? ?Home Medications ?Prior to Admission medications   ?Medication Sig Start Date End Date Taking? Authorizing Provider  ?cholecalciferol (VITAMIN D) 1000 UNITS tablet Take 1,000 Units by mouth daily.    [provider]  ?cyclobenzaprine (FLEXERIL) 5 MG tablet Take 1 tablet (5 mg total) by mouth at bedtime as needed for muscle spasms. 07/18/13   Elvina SidleLauenstein, Kurt, MD  ?diazepam (VALIUM) 5 MG tablet Take 2 tablets (10 mg total) by mouth every 8 (eight) hours as needed for anxiety. 07/11/12   Gwyneth SproutPlunkett, Whitney, MD  ?DULoxetine (CYMBALTA) 30 MG capsule Take 30 mg by mouth 3 (three) times daily.     [provider]  ?FLUoxetine (PROZAC) 20 MG capsule Take 20 mg by mouth daily.    [provider]  ?LORazepam (ATIVAN) 1 MG tablet Take 1 mg by mouth every 6 (six) hours as needed. anxiety    [provider]  ?meloxicam (MOBIC) 7.5 MG tablet Take one with breakfast for pain and inflammation and shoulder. If well tolerated increase to twice daily with food if needed 05/19/13   Peyton NajjarHopper, David H, MD  ?methocarbamol (ROBAXIN-750) 750 MG tablet Take one in the morning, one in the afternoon, and 2 at night for muscle  relaxant 05/19/13   Peyton NajjarHopper, David H, MD  ?ondansetron (ZOFRAN ODT) 4 MG disintegrating tablet Take 1 tablet (4 mg total) by mouth every 8 (eight) hours as needed for nausea or vomiting. 09/16/19   LampteyBritta Mccreedy, Philip O, MD  ?oxyCODONE-acetaminophen (PERCOCET/ROXICET) 5-325 MG per tablet Take 1 tablet by mouth every 8 (eight) hours as needed. 07/18/13   Elvina SidleLauenstein, Kurt, MD  ?pantoprazole (PROTONIX) 20 MG tablet Take 1 tablet (20 mg total) by mouth 2 (two) times daily. 09/16/19 10/16/19  Merrilee JanskyLamptey, Philip O, MD  ?zolpidem (AMBIEN) 10 MG tablet Take 5 mg by mouth at bedtime as needed. sleep    [provider]  ?amphetamine-dextroamphetamine (ADDERALL) 20 MG tablet Take 20 mg by mouth 3 (three) times daily.  09/16/19  [provider]  ?   ? ?Allergies    ?Asa [aspirin] and Penicillins   ? ?Review of Systems   ?Review of Systems  ?Genitourinary:  Positive for flank pain.  ? ?Physical Exam ?Updated Vital Signs ?BP (!) 158/91 (BP Location: Left Arm)   Pulse 99   Temp 97.6 ?F (36.4 ?C) (Oral)   Resp (!) 22   Ht 5\' 4"  (1.626 m)   Wt 72.6 kg   SpO2 97%   BMI 27.46 kg/m?  ?Physical Exam ?Vitals and nursing note reviewed.  ?Constitutional:   ?   General: She is not in acute distress. ?   Appearance: Normal  appearance. She is well-developed. She is not diaphoretic.  ?   Comments: Patient is tearful, rolling around in the bed.  Appears extremely uncomfortable  ?HENT:  ?   Head: Normocephalic and atraumatic.  ?   Right Ear: External ear normal.  ?   Left Ear: External ear normal.  ?   Nose: Nose normal.  ?   Mouth/Throat:  ?   Mouth: Mucous membranes are moist.  ?Eyes:  ?   General: No scleral icterus. ?   Conjunctiva/sclera: Conjunctivae normal.  ?Cardiovascular:  ?   Rate and Rhythm: Normal rate and regular rhythm.  ?   Heart sounds: Normal heart sounds. No murmur heard. ?  No friction rub. No gallop.  ?Pulmonary:  ?   Effort: Pulmonary effort is normal. No respiratory distress.  ?   Breath sounds: Normal breath  sounds.  ?Abdominal:  ?   General: Bowel sounds are normal. There is no distension.  ?   Palpations: Abdomen is soft. There is no mass.  ?   Tenderness: There is no abdominal tenderness. There is no right CVA tenderness, left CVA tenderness or guarding.  ?Musculoskeletal:  ?   Cervical back: Normal range of motion.  ?Skin: ?   General: Skin is warm and dry.  ?Neurological:  ?   Mental Status: She is alert and oriented to person, place, and time.  ?Psychiatric:     ?   Behavior: Behavior normal. Behavior is cooperative.  ? ? ?ED Results / Procedures / Treatments   ?Labs ?(all labs ordered are listed, but only abnormal results are displayed) ?Labs Reviewed  ?COMPREHENSIVE METABOLIC PANEL - Abnormal; Notable for the following components:  ?    Result Value  ? Potassium 3.4 (*)   ? Glucose, Bld 133 (*)   ? All other components within normal limits  ?CBC WITH DIFFERENTIAL/PLATELET - Abnormal; Notable for the following components:  ? WBC 11.3 (*)   ? Platelets 402 (*)   ? Lymphs Abs 4.4 (*)   ? All other components within normal limits  ?URINALYSIS, ROUTINE W REFLEX MICROSCOPIC - Abnormal; Notable for the following components:  ? APPearance CLOUDY (*)   ? Hgb urine dipstick SMALL (*)   ? Ketones, ur 5 (*)   ? Leukocytes,Ua TRACE (*)   ? Bacteria, UA RARE (*)   ? All other components within normal limits  ?HCG, QUANTITATIVE, PREGNANCY - Abnormal; Notable for the following components:  ? hCG, Beta Chain, Quant, S 9 (*)   ? All other components within normal limits  ?LIPASE, BLOOD  ? ? ?EKG ?None ? ?Radiology ?No results found. ? ?Procedures ?Procedures  ? ? ?Medications Ordered in ED ?Medications  ?ondansetron (ZOFRAN) injection 4 mg (4 mg Intravenous Given 10/23/21 0719)  ?HYDROmorphone (DILAUDID) injection 1 mg (1 mg Intravenous Given 10/23/21 0719)  ? ? ?ED Course/ Medical Decision Making/ A&P ?Clinical Course as of 10/23/21 0858  ?Wed Oct 23, 2021  ?0732 hCG, quantitative, pregnancy(!) ?Patient had hysterectomy - this is  false positive and was ordered at triage. [AH]  ?4403 Urinalysis, Routine w reflex microscopic Urine, Clean Catch(!) ?UA shows hematuria calcium oxalate crystals no evidence of infection [AH]  ?4742 CBC with Diff(!) ?CBC with mildly elevated platelet count likely acute phase reactant along with elevated white blood cell count [AH]  ?5956 Comprehensive metabolic panel(!) ?CMP shows mildly elevated blood glucose likely also acute phase reactant. [AH]  ?0734 Lipase, blood ?Lipase within normal limits [AH]  ?3875 CT RENAL STONE  STUDY ?I personally visualized a Ct renal stone study. Pt has obstructing stones in the left UPJ with associated hydronephrosis and perinephric stranding. Case discussed with Dr. Retta Diones who recommends normal Ureteral colic care with pain control and OP f/u if able to control pain. [AH]  ?  ?Clinical Course User Index ?[AH] Arthor Captain, PA-C  ? ?                        ?Medical Decision Making ?Given the large differential diagnosis for [employer], the decision making in this case is of high complexity. ?I have reviewed the patient's labs  as reviewed in the  ?I personally reviewed the patient's images which show UPJ stones. ?After evaluating all of the data points in this case, the presentation of Karen Rhodes is  most likely Ureteral colic due to kidney stones. ? ?The presentation NOT consistent with an infected stone, nephric abscess, sepsis, or renal failure. ? ?Similarly, this presentation is NOT consistent with AAA; Mesenteric Ischemia; Bowel Perforation; Bowel Obstruction; Sigmoid Volvulus; Diverticulitis; Appendicitis; Peritonitis; Cholecystitis, ascending cholangitis or other gallbladder disease; perforated ulcer; significant GI bleeding, splenic rupture/infarction; Hepatic abscess; or other surgical/acute abdomen. ? ?Similarly, this presentation is NOT consistent with ACS or Myocardial Ischemia; Pulmonary Embolism; fistula; incarcerated hernia; Pancreatitis, Aortic Dissection;  Diabetic Ketoacidosis; Ischemic colitis; Psoas or other abscess; Methanol poisoning; Heavy metal toxicity; or porphyria. ? ?Similarly, this presentation is NOT consistent with acute coronary syndrome, pulmon

## 2021-10-23 NOTE — ED Triage Notes (Signed)
Pt reports with sharp left flank pain since 0430 this morning.  ?

## 2021-10-24 ENCOUNTER — Encounter (HOSPITAL_COMMUNITY)
Admission: EM | Disposition: A | Discharge status: Home / Self Care | Payer: Self-pay | Source: Home / Self Care | Attending: Internal Medicine

## 2021-10-24 ENCOUNTER — Encounter (HOSPITAL_COMMUNITY): Payer: Self-pay

## 2021-10-24 ENCOUNTER — Emergency Department (HOSPITAL_COMMUNITY): Payer: BC Managed Care – PPO | Admitting: Anesthesiology

## 2021-10-24 ENCOUNTER — Inpatient Hospital Stay (HOSPITAL_COMMUNITY)
Admission: EM | Admit: 2021-10-24 | Discharge: 2021-10-29 | DRG: 853 | Disposition: A | Discharge status: Home / Self Care | Payer: BC Managed Care – PPO | Attending: Internal Medicine | Admitting: Internal Medicine

## 2021-10-24 ENCOUNTER — Emergency Department (HOSPITAL_COMMUNITY): Payer: BC Managed Care – PPO

## 2021-10-24 ENCOUNTER — Other Ambulatory Visit: Payer: Self-pay

## 2021-10-24 DIAGNOSIS — R6521 Severe sepsis with septic shock: Secondary | ICD-10-CM | POA: Diagnosis present

## 2021-10-24 DIAGNOSIS — N179 Acute kidney failure, unspecified: Secondary | ICD-10-CM

## 2021-10-24 DIAGNOSIS — N201 Calculus of ureter: Secondary | ICD-10-CM | POA: Diagnosis not present

## 2021-10-24 DIAGNOSIS — F32A Depression, unspecified: Secondary | ICD-10-CM | POA: Diagnosis present

## 2021-10-24 DIAGNOSIS — F419 Anxiety disorder, unspecified: Secondary | ICD-10-CM | POA: Diagnosis present

## 2021-10-24 DIAGNOSIS — K5909 Other constipation: Secondary | ICD-10-CM | POA: Diagnosis present

## 2021-10-24 DIAGNOSIS — E162 Hypoglycemia, unspecified: Secondary | ICD-10-CM | POA: Diagnosis not present

## 2021-10-24 DIAGNOSIS — Z90710 Acquired absence of both cervix and uterus: Secondary | ICD-10-CM

## 2021-10-24 DIAGNOSIS — F17210 Nicotine dependence, cigarettes, uncomplicated: Secondary | ICD-10-CM | POA: Diagnosis present

## 2021-10-24 DIAGNOSIS — J9601 Acute respiratory failure with hypoxia: Secondary | ICD-10-CM | POA: Diagnosis not present

## 2021-10-24 DIAGNOSIS — Z20822 Contact with and (suspected) exposure to covid-19: Secondary | ICD-10-CM | POA: Diagnosis present

## 2021-10-24 DIAGNOSIS — N390 Urinary tract infection, site not specified: Secondary | ICD-10-CM

## 2021-10-24 DIAGNOSIS — Z9049 Acquired absence of other specified parts of digestive tract: Secondary | ICD-10-CM | POA: Diagnosis not present

## 2021-10-24 DIAGNOSIS — N136 Pyonephrosis: Secondary | ICD-10-CM | POA: Diagnosis present

## 2021-10-24 DIAGNOSIS — A419 Sepsis, unspecified organism: Secondary | ICD-10-CM

## 2021-10-24 DIAGNOSIS — Z8249 Family history of ischemic heart disease and other diseases of the circulatory system: Secondary | ICD-10-CM

## 2021-10-24 DIAGNOSIS — R109 Unspecified abdominal pain: Secondary | ICD-10-CM | POA: Diagnosis not present

## 2021-10-24 DIAGNOSIS — R6520 Severe sepsis without septic shock: Secondary | ICD-10-CM | POA: Diagnosis not present

## 2021-10-24 DIAGNOSIS — E860 Dehydration: Secondary | ICD-10-CM

## 2021-10-24 DIAGNOSIS — R10A2 Flank pain, left side: Secondary | ICD-10-CM

## 2021-10-24 DIAGNOSIS — R031 Nonspecific low blood-pressure reading: Secondary | ICD-10-CM

## 2021-10-24 DIAGNOSIS — E871 Hypo-osmolality and hyponatremia: Secondary | ICD-10-CM | POA: Diagnosis present

## 2021-10-24 DIAGNOSIS — Z79899 Other long term (current) drug therapy: Secondary | ICD-10-CM | POA: Diagnosis not present

## 2021-10-24 DIAGNOSIS — R579 Shock, unspecified: Secondary | ICD-10-CM | POA: Diagnosis not present

## 2021-10-24 DIAGNOSIS — Z809 Family history of malignant neoplasm, unspecified: Secondary | ICD-10-CM

## 2021-10-24 HISTORY — DX: Sepsis, unspecified organism: A41.9

## 2021-10-24 HISTORY — PX: CYSTOSCOPY WITH URETEROSCOPY AND STENT PLACEMENT: SHX6377

## 2021-10-24 LAB — URINALYSIS, ROUTINE W REFLEX MICROSCOPIC
Bilirubin Urine: NEGATIVE
Glucose, UA: NEGATIVE mg/dL
Ketones, ur: NEGATIVE mg/dL
Nitrite: NEGATIVE
Protein, ur: 30 mg/dL — AB
Specific Gravity, Urine: 1.019 (ref 1.005–1.030)
pH: 5 (ref 5.0–8.0)

## 2021-10-24 LAB — CBC
HCT: 37.6 % (ref 36.0–46.0)
HCT: 32.7 % — ABNORMAL LOW (ref 36.0–46.0)
Hemoglobin: 12.5 g/dL (ref 12.0–15.0)
Hemoglobin: 10.3 g/dL — ABNORMAL LOW (ref 12.0–15.0)
MCH: 30.5 pg (ref 26.0–34.0)
MCH: 29.7 pg (ref 26.0–34.0)
MCHC: 33.2 g/dL (ref 30.0–36.0)
MCHC: 31.5 g/dL (ref 30.0–36.0)
MCV: 91.7 fL (ref 80.0–100.0)
MCV: 94.2 fL (ref 80.0–100.0)
Platelets: 235 10*3/uL (ref 150–400)
Platelets: 183 10*3/uL (ref 150–400)
RBC: 4.1 MIL/uL (ref 3.87–5.11)
RBC: 3.47 MIL/uL — ABNORMAL LOW (ref 3.87–5.11)
RDW: 13.8 % (ref 11.5–15.5)
RDW: 14 % (ref 11.5–15.5)
WBC: 14.2 10*3/uL — ABNORMAL HIGH (ref 4.0–10.5)
WBC: 6.3 10*3/uL (ref 4.0–10.5)
nRBC: 0 % (ref 0.0–0.2)
nRBC: 0 % (ref 0.0–0.2)

## 2021-10-24 LAB — BASIC METABOLIC PANEL
Anion gap: 11 (ref 5–15)
BUN: UNDETERMINED mg/dL (ref 6–20)
CO2: 18 mmol/L — ABNORMAL LOW (ref 22–32)
Calcium: 8.6 mg/dL — ABNORMAL LOW (ref 8.9–10.3)
Chloride: 99 mmol/L (ref 98–111)
Creatinine, Ser: 1.68 mg/dL — ABNORMAL HIGH (ref 0.44–1.00)
GFR, Estimated: 35 mL/min — ABNORMAL LOW (ref >60)
Glucose, Bld: 96 mg/dL (ref 70–99)
Potassium: 3.8 mmol/L (ref 3.5–5.1)
Sodium: 128 mmol/L — ABNORMAL LOW (ref 135–145)

## 2021-10-24 LAB — LACTIC ACID, PLASMA
Lactic Acid, Venous: 3 mmol/L (ref 0.5–1.9)
Lactic Acid, Venous: 5.4 mmol/L (ref 0.5–1.9)
Lactic Acid, Venous: 3 mmol/L (ref 0.5–1.9)

## 2021-10-24 LAB — HIV ANTIBODY (ROUTINE TESTING W REFLEX): HIV Screen 4th Generation wRfx: NONREACTIVE

## 2021-10-24 LAB — BUN: BUN: 24 mg/dL — ABNORMAL HIGH (ref 6–20)

## 2021-10-24 LAB — CREATININE, SERUM
Creatinine, Ser: 1.84 mg/dL — ABNORMAL HIGH (ref 0.44–1.00)
GFR, Estimated: 31 mL/min — ABNORMAL LOW (ref >60)

## 2021-10-24 LAB — MRSA NEXT GEN BY PCR, NASAL: MRSA by PCR Next Gen: NOT DETECTED

## 2021-10-24 SURGERY — CYSTOURETEROSCOPY, WITH STENT INSERTION
Anesthesia: General | Site: Ureter | Laterality: Left

## 2021-10-24 MED ORDER — PHENYLEPHRINE 80 MCG/ML (10ML) SYRINGE FOR IV PUSH (FOR BLOOD PRESSURE SUPPORT)
PREFILLED_SYRINGE | INTRAVENOUS | Status: AC
  Filled 2021-10-24: qty 10

## 2021-10-24 MED ORDER — FENTANYL CITRATE PF 50 MCG/ML IJ SOSY
PREFILLED_SYRINGE | INTRAMUSCULAR | Status: AC
  Filled 2021-10-24: qty 1

## 2021-10-24 MED ORDER — CEFTRIAXONE SODIUM 1 G IJ SOLR
1.0000 g | Freq: Once | INTRAMUSCULAR | Status: AC
  Administered 2021-10-24: 1 g via INTRAVENOUS
  Filled 2021-10-24: qty 10

## 2021-10-24 MED ORDER — SODIUM CHLORIDE 0.9 % IR SOLN
Status: DC | PRN
  Administered 2021-10-24: 3000 mL

## 2021-10-24 MED ORDER — LIDOCAINE HCL (PF) 2 % IJ SOLN
INTRAMUSCULAR | Status: AC
  Filled 2021-10-24: qty 10

## 2021-10-24 MED ORDER — PHENYLEPHRINE 80 MCG/ML (10ML) SYRINGE FOR IV PUSH (FOR BLOOD PRESSURE SUPPORT)
PREFILLED_SYRINGE | INTRAVENOUS | Status: DC | PRN
Start: 2021-10-24 — End: 2021-10-24
  Administered 2021-10-24 (×2): 80 ug via INTRAVENOUS
  Administered 2021-10-24: 160 ug via INTRAVENOUS
  Administered 2021-10-24: 80 ug via INTRAVENOUS
  Administered 2021-10-24: 160 ug via INTRAVENOUS

## 2021-10-24 MED ORDER — PHENYLEPHRINE HCL (PRESSORS) 10 MG/ML IV SOLN
INTRAVENOUS | Status: AC
  Filled 2021-10-24: qty 1

## 2021-10-24 MED ORDER — OXYCODONE HCL 5 MG PO TABS
5.0000 mg | ORAL_TABLET | Freq: Four times a day (QID) | ORAL | Status: DC | PRN
Start: 2021-10-24 — End: 2021-10-29
  Administered 2021-10-24 – 2021-10-29 (×14): 5 mg via ORAL
  Filled 2021-10-24 (×15): qty 1

## 2021-10-24 MED ORDER — PHENYLEPHRINE HCL-NACL 20-0.9 MG/250ML-% IV SOLN
INTRAVENOUS | Status: DC | PRN
  Administered 2021-10-24: 65 ug/min via INTRAVENOUS

## 2021-10-24 MED ORDER — OXYCODONE HCL 5 MG PO TABS: 5.0000 mg | ORAL_TABLET | Freq: Once | ORAL | Status: DC | PRN

## 2021-10-24 MED ORDER — LACTATED RINGERS IV BOLUS
1000.0000 mL | Freq: Once | INTRAVENOUS | Status: AC
  Administered 2021-10-24: 1000 mL via INTRAVENOUS

## 2021-10-24 MED ORDER — SODIUM CHLORIDE 0.9 % IR SOLN
Status: DC | PRN
  Administered 2021-10-24: 1000 mL

## 2021-10-24 MED ORDER — FENTANYL CITRATE PF 50 MCG/ML IJ SOSY
25.0000 ug | PREFILLED_SYRINGE | INTRAMUSCULAR | Status: DC | PRN
  Administered 2021-10-24: 50 ug via INTRAVENOUS

## 2021-10-24 MED ORDER — ONDANSETRON HCL 4 MG/2ML IJ SOLN
4.0000 mg | Freq: Once | INTRAMUSCULAR | Status: AC
  Administered 2021-10-24: 4 mg via INTRAVENOUS
  Filled 2021-10-24: qty 2

## 2021-10-24 MED ORDER — PANTOPRAZOLE SODIUM 40 MG PO TBEC
40.0000 mg | DELAYED_RELEASE_TABLET | Freq: Every day | ORAL | Status: DC
  Administered 2021-10-24 – 2021-10-29 (×6): 40 mg via ORAL
  Filled 2021-10-24 (×6): qty 1

## 2021-10-24 MED ORDER — KETOROLAC TROMETHAMINE 15 MG/ML IJ SOLN
15.0000 mg | Freq: Once | INTRAMUSCULAR | Status: AC
  Administered 2021-10-24: 15 mg via INTRAVENOUS
  Filled 2021-10-24: qty 1

## 2021-10-24 MED ORDER — ONDANSETRON HCL 4 MG/2ML IJ SOLN
INTRAMUSCULAR | Status: DC | PRN
  Administered 2021-10-24: 4 mg via INTRAVENOUS

## 2021-10-24 MED ORDER — PROPOFOL 10 MG/ML IV BOLUS
INTRAVENOUS | Status: DC | PRN
  Administered 2021-10-24: 100 mg via INTRAVENOUS

## 2021-10-24 MED ORDER — SODIUM CHLORIDE 0.9 % IV BOLUS
1000.0000 mL | Freq: Once | INTRAVENOUS | Status: AC
  Administered 2021-10-24: 1000 mL via INTRAVENOUS

## 2021-10-24 MED ORDER — HYDROMORPHONE HCL 1 MG/ML IJ SOLN
INTRAMUSCULAR | Status: AC
  Filled 2021-10-24: qty 1

## 2021-10-24 MED ORDER — PROPOFOL 10 MG/ML IV BOLUS
INTRAVENOUS | Status: AC
  Filled 2021-10-24: qty 20

## 2021-10-24 MED ORDER — SODIUM CHLORIDE 0.9 % IV SOLN
250.0000 mL | INTRAVENOUS | Status: DC
  Administered 2021-10-24: 250 mL via INTRAVENOUS

## 2021-10-24 MED ORDER — DEXAMETHASONE SODIUM PHOSPHATE 10 MG/ML IJ SOLN
INTRAMUSCULAR | Status: DC | PRN
Start: 2021-10-24 — End: 2021-10-24
  Administered 2021-10-24: 10 mg via INTRAVENOUS

## 2021-10-24 MED ORDER — MIDAZOLAM HCL 5 MG/5ML IJ SOLN
INTRAMUSCULAR | Status: DC | PRN
Start: 2021-10-24 — End: 2021-10-24
  Administered 2021-10-24: 1 mg via INTRAVENOUS

## 2021-10-24 MED ORDER — NOREPINEPHRINE 4 MG/250ML-% IV SOLN
2.0000 ug/min | INTRAVENOUS | Status: DC
  Administered 2021-10-24: 2 ug/min via INTRAVENOUS
  Filled 2021-10-24: qty 250

## 2021-10-24 MED ORDER — SODIUM CHLORIDE 0.9 % IV SOLN
2.0000 g | INTRAVENOUS | Status: DC
  Administered 2021-10-25 – 2021-10-28 (×4): 2 g via INTRAVENOUS
  Filled 2021-10-24 (×4): qty 20

## 2021-10-24 MED ORDER — ENOXAPARIN SODIUM 40 MG/0.4ML IJ SOSY
40.0000 mg | PREFILLED_SYRINGE | Freq: Every day | INTRAMUSCULAR | Status: DC
  Administered 2021-10-24 – 2021-10-28 (×5): 40 mg via SUBCUTANEOUS
  Filled 2021-10-24 (×5): qty 0.4

## 2021-10-24 MED ORDER — CHLORHEXIDINE GLUCONATE CLOTH 2 % EX PADS
6.0000 | MEDICATED_PAD | Freq: Every day | CUTANEOUS | Status: DC
  Administered 2021-10-24 – 2021-10-28 (×5): 6 via TOPICAL

## 2021-10-24 MED ORDER — IOHEXOL 300 MG/ML  SOLN
INTRAMUSCULAR | Status: DC | PRN
  Administered 2021-10-24: 10 mL

## 2021-10-24 MED ORDER — PHENYLEPHRINE HCL-NACL 20-0.9 MG/250ML-% IV SOLN
10.0000 ug/min | INTRAVENOUS | Status: DC
  Administered 2021-10-24: 50 ug/min via INTRAVENOUS

## 2021-10-24 MED ORDER — SODIUM CHLORIDE 0.9 % IV SOLN: 1.0000 g | INTRAVENOUS | Status: DC

## 2021-10-24 MED ORDER — VASOPRESSIN 20 UNIT/ML IV SOLN
INTRAVENOUS | Status: AC
  Filled 2021-10-24: qty 1

## 2021-10-24 MED ORDER — NICOTINE 21 MG/24HR TD PT24
21.0000 mg | MEDICATED_PATCH | TRANSDERMAL | Status: DC
  Administered 2021-10-24 – 2021-10-28 (×5): 21 mg via TRANSDERMAL
  Filled 2021-10-24 (×5): qty 1

## 2021-10-24 MED ORDER — DOCUSATE SODIUM 100 MG PO CAPS
100.0000 mg | ORAL_CAPSULE | Freq: Two times a day (BID) | ORAL | Status: DC | PRN
  Administered 2021-10-28: 100 mg via ORAL
  Filled 2021-10-24: qty 1

## 2021-10-24 MED ORDER — ONDANSETRON HCL 4 MG/2ML IJ SOLN: 4.0000 mg | Freq: Once | INTRAMUSCULAR | Status: DC | PRN

## 2021-10-24 MED ORDER — FENTANYL CITRATE (PF) 100 MCG/2ML IJ SOLN
INTRAMUSCULAR | Status: AC
  Filled 2021-10-24: qty 2

## 2021-10-24 MED ORDER — ORAL CARE MOUTH RINSE
15.0000 mL | Freq: Two times a day (BID) | OROMUCOSAL | Status: DC
  Administered 2021-10-24 – 2021-10-28 (×10): 15 mL via OROMUCOSAL

## 2021-10-24 MED ORDER — HYDROMORPHONE HCL 1 MG/ML IJ SOLN
0.5000 mg | Freq: Once | INTRAMUSCULAR | Status: AC
  Administered 2021-10-24: 0.5 mg via INTRAVENOUS
  Filled 2021-10-24: qty 1

## 2021-10-24 MED ORDER — FENTANYL CITRATE PF 50 MCG/ML IJ SOSY
PREFILLED_SYRINGE | INTRAMUSCULAR | Status: AC
  Administered 2021-10-24: 50 ug via INTRAVENOUS
  Filled 2021-10-24: qty 1

## 2021-10-24 MED ORDER — FENTANYL CITRATE PF 50 MCG/ML IJ SOSY
PREFILLED_SYRINGE | INTRAMUSCULAR | Status: AC
  Administered 2021-10-24: 25 ug via INTRAVENOUS
  Filled 2021-10-24: qty 1

## 2021-10-24 MED ORDER — PHENYLEPHRINE HCL-NACL 20-0.9 MG/250ML-% IV SOLN
25.0000 ug/min | INTRAVENOUS | Status: DC
  Administered 2021-10-24: 55 ug/min via INTRAVENOUS

## 2021-10-24 MED ORDER — OXYCODONE HCL 5 MG/5ML PO SOLN: 5.0000 mg | Freq: Once | ORAL | Status: DC | PRN

## 2021-10-24 MED ORDER — LACTATED RINGERS IV SOLN: INTRAVENOUS | Status: DC | PRN

## 2021-10-24 MED ORDER — VASOPRESSIN 20 UNIT/ML IV SOLN
INTRAVENOUS | Status: DC | PRN
  Administered 2021-10-24 (×2): 1 [IU] via INTRAVENOUS

## 2021-10-24 MED ORDER — MIDAZOLAM HCL 2 MG/2ML IJ SOLN
INTRAMUSCULAR | Status: AC
  Filled 2021-10-24: qty 2

## 2021-10-24 MED ORDER — POLYETHYLENE GLYCOL 3350 17 G PO PACK
17.0000 g | PACK | Freq: Every day | ORAL | Status: DC | PRN
  Administered 2021-10-27: 17 g via ORAL
  Filled 2021-10-24: qty 1

## 2021-10-24 MED ORDER — FENTANYL CITRATE (PF) 100 MCG/2ML IJ SOLN
INTRAMUSCULAR | Status: DC | PRN
  Administered 2021-10-24: 25 ug via INTRAVENOUS

## 2021-10-24 MED ORDER — ACETAMINOPHEN 325 MG PO TABS
650.0000 mg | ORAL_TABLET | Freq: Four times a day (QID) | ORAL | Status: DC | PRN
  Administered 2021-10-26 – 2021-10-29 (×10): 650 mg via ORAL
  Filled 2021-10-24 (×11): qty 2

## 2021-10-24 MED ORDER — ONDANSETRON HCL 4 MG/2ML IJ SOLN
4.0000 mg | Freq: Four times a day (QID) | INTRAMUSCULAR | Status: DC | PRN
  Administered 2021-10-24 – 2021-10-28 (×4): 4 mg via INTRAVENOUS
  Filled 2021-10-24 (×4): qty 2

## 2021-10-24 MED ORDER — LIDOCAINE 2% (20 MG/ML) 5 ML SYRINGE
INTRAMUSCULAR | Status: DC | PRN
  Administered 2021-10-24: 100 mg via INTRAVENOUS

## 2021-10-24 MED ORDER — SODIUM CHLORIDE 0.9 % IV SOLN
1.0000 g | Freq: Once | INTRAVENOUS | Status: AC
  Administered 2021-10-24: 1 g via INTRAVENOUS
  Filled 2021-10-24: qty 10

## 2021-10-24 SURGICAL SUPPLY — 25 items
BAG URO CATCHER STRL LF (MISCELLANEOUS) ×2 IMPLANT
BASKET ZERO TIP NITINOL 2.4FR (BASKET) ×2 IMPLANT
CATH URETL OPEN END 6FR 70 (CATHETERS) IMPLANT
EXTRACTOR STONE 1.7FRX115CM (UROLOGICAL SUPPLIES) ×2 IMPLANT
GLOVE SURG LX 8.0 MICRO (GLOVE) ×1
GLOVE SURG LX STRL 8.0 MICRO (GLOVE) ×1 IMPLANT
GOWN STRL REUS W/ TWL XL LVL3 (GOWN DISPOSABLE) ×1 IMPLANT
GOWN STRL REUS W/TWL XL LVL3 (GOWN DISPOSABLE) ×2
GUIDEWIRE ANG ZIPWIRE 038X150 (WIRE) IMPLANT
GUIDEWIRE STR DUAL SENSOR (WIRE) ×2 IMPLANT
IV NS 1000ML (IV SOLUTION) ×2
IV NS 1000ML BAXH (IV SOLUTION) ×1 IMPLANT
KIT TURNOVER KIT A (KITS) IMPLANT
LASER FIB FLEXIVA PULSE ID 365 (Laser) IMPLANT
MANIFOLD NEPTUNE II (INSTRUMENTS) ×2 IMPLANT
PACK CYSTO (CUSTOM PROCEDURE TRAY) ×2 IMPLANT
SHEATH NAVIGATOR HD 11/13X28 (SHEATH) IMPLANT
SHEATH NAVIGATOR HD 11/13X36 (SHEATH) IMPLANT
SHEATH NAVIGATOR HD 12/14X46 (SHEATH) IMPLANT
STENT URET 6FRX24 CONTOUR (STENTS) ×1 IMPLANT
TRACTIP FLEXIVA PULS ID 200XHI (Laser) IMPLANT
TRACTIP FLEXIVA PULSE ID 200 (Laser)
TRAY FOLEY MTR SLVR 16FR STAT (SET/KITS/TRAYS/PACK) ×1 IMPLANT
TUBING CONNECTING 10 (TUBING) ×2 IMPLANT
TUBING UROLOGY SET (TUBING) ×2 IMPLANT

## 2021-10-24 NOTE — H&P (Signed)
? ?NAME:  Karen Rhodes, MRN:  [id number], DOB:  Aug 01, 1960, LOS: 0 ?ADMISSION DATE:  10/24/2021, CONSULTATION DATE:  10/24/2021 ?REFERRING MD:  Dr. Retta Diones, CHIEF COMPLAINT:  Sepsis with hypotension  ? ?History of Present Illness:  ?Karen Rhodes is a 61yo female with PMH significant for renal stones, anxiety, and depression who presented to the ED for complaints of not being able to urinated with associated left lower flank pain. Left flank pain began 4 days prior to admi Denies any other complaints no fever, chills, nausea, or decreased appetite, ? ?On ED arrival patient was seen with tachycardia, mild tachypnea, and borderline hypotension. Lab work reveled NA 128, creatinine 1.68, GFR 35, lactic 3.0, and WBC 14.2. CT renal study was preformed and showed obstructing 30mm stones to the left ureteropelvic junction ?resulting in mild to moderate left-sided hydronephrosis.. Urology was consulted and decided to urgently stent the left kidney. Post procedure patient remained hypotensive despite IV hydration prompting initiation of vasopressors and consult to PCCM.  ? ?Pertinent  Medical History  ?Renal stones, anxiety, and depression ? ?Significant Hospital Events: ?Including procedures, antibiotic start and stop dates in addition to other pertinent events   ?4/27 presented with left flank pain and inability to urinate, take urgently for left renal stent secondary to obstructing stone  ? ?Interim History / Subjective:  ?As above  ? ?Objective   ?Blood pressure (!) 86/52, pulse 100, temperature 98.4 ?F (36.9 ?C), resp. rate (!) 23, height 5\' 4"  (1.626 m), weight 72.6 kg, SpO2 91 %. ?   ?   ? ?Intake/Output Summary (Last 24 hours) at 10/24/2021 0859 ?Last data filed at 10/24/2021 0747 ?Gross per 24 hour  ?Intake 1600 ml  ?Output --  ?Net 1600 ml  ? ?Filed Weights  ? 10/24/21 0654 10/24/21 0657  ?Weight: 72.6 kg 72.6 kg  ? ? ?Examination: ?General: Acute ill appearing middle aged female lying in bed, in NAD ?HEENT: [place]/AT, MM  pink/moist, PERRL,  ?Neuro: Sleepy but easily arousalable, Alert and oriented x3 ?CV: s1s2 regular rate and rhythm, no murmur, rubs, or gallops,  ?PULM:  Clear to ascultation, no increased work of breathing, no added breath sounds  ?GI: soft, bowel sounds active in all 4 quadrants, non-tender, non-distended ?Extremities: warm/dry, no edema  ?Skin: no rashes or lesions ? ?Resolved Hospital Problem list   ? ? ?Assessment & Plan:  ?Severe sepsis as evidence by elevated lactic acid, leukocytosis, acute kidney injury, tachycardia, and hypotension  ?-Lactic acid on arrival 3 ?P: ?Admit ICU  ?Supplemental oxygen as needed for sat goal > 92 ?Follow cultures  ?IV ceftriaxone  ?Aggressive IV hydration provided on admit  ?Pressors for MAP goal < 65  ?Trend lactic acid ?Procalcitonin ?Monitor urine output ? ?Left obstructing renal stone resulting in hydronephrosis  ?-S/P left renal stent per Dr. 10/26/21 4/27 ?P: ?Management per Urology  ?Follow cultures  ?Pain control  ?Further intervention per urology  ? ?Acute Kidney Injury  ?-in the setting of obstructing renal stone. Creatinine on admit 1.68 with GFR 35. Creatinine march of 2021 0.77 with GFR >60 ?P: ?Follow renal function  ?Monitor urine output ?Trend Bmet ?Avoid nephrotoxins ?Ensure adequate renal perfusion  ? ?Hyponatremia  ?P: ?Trend Bmet ? ?Best Practice (right click and "Reselect all SmartList Selections" daily)  ? ?Diet/type: Regular consistency (see orders) ?DVT prophylaxis: LMWH ?GI prophylaxis: PPI ?Lines: N/A ?Foley:  Yes, and it is still needed ?Code Status:  full code ?Last date of multidisciplinary goals of care discussion: Update  patient and family daily  ? ?Labs   ?CBC: ?Recent Labs  ?Lab 10/23/21 ?0625 10/24/21 ?0115  ?WBC 11.3* 14.2*  ?NEUTROABS 6.1  --   ?HGB 14.0 12.5  ?HCT 43.2 37.6  ?MCV 92.1 91.7  ?PLT 402* 235  ? ? ?Basic Metabolic Panel: ?Recent Labs  ?Lab 10/23/21 ?0625 10/24/21 ?0115 10/24/21 ?0253  ?NA 137 128*  --   ?K 3.4* 3.8  --   ?CL 103  99  --   ?CO2 26 18*  --   ?GLUCOSE 133* 96  --   ?BUN 14 QUANTITY NOT SUFFICIENT, UNABLE TO PERFORM TEST 24*  ?CREATININE 0.83 1.68*  --   ?CALCIUM 9.1 8.6*  --   ? ?GFR: ?Estimated Creatinine Clearance: 35.7 mL/min (A) (by C-G formula based on SCr of 1.68 mg/dL (H)). ?Recent Labs  ?Lab 10/23/21 ?0625 10/24/21 ?0115 10/24/21 ?[id number]  ?WBC 11.3* 14.2*  --   ?LATICACIDVEN  --   --  3.0*  ? ? ?Liver Function Tests: ?Recent Labs  ?Lab 10/23/21 ?0625  ?AST 15  ?ALT 13  ?ALKPHOS 73  ?BILITOT 0.4  ?PROT 7.7  ?ALBUMIN 3.9  ? ?Recent Labs  ?Lab 10/23/21 ?0625  ?LIPASE 32  ? ?No results for input(s): AMMONIA in the last 168 hours. ? ?ABG ?No results found for: PHART, PCO2ART, PO2ART, HCO3, TCO2, ACIDBASEDEF, O2SAT  ? ?Coagulation Profile: ?No results for input(s): INR, PROTIME in the last 168 hours. ? ?Cardiac Enzymes: ?No results for input(s): CKTOTAL, CKMB, CKMBINDEX, TROPONINI in the last 168 hours. ? ?HbA1C: ?No results found for: HGBA1C ? ?CBG: ?No results for input(s): GLUCAP in the last 168 hours. ? ?Review of Systems:   ?Please see the history of present illness. All other systems reviewed and are negative  ? ?Past Medical History:  ?She,  has a past medical history of Allergy, Anxiety, Depression, and Ulcer.  ? ?Surgical History:  ? ?Past Surgical History:  ?Procedure Laterality Date  ? ABDOMINAL HYSTERECTOMY    ? CHOLECYSTECTOMY    ? TUBAL LIGATION    ?  ? ?Social History:  ? reports that she has been smoking cigarettes. She has never used smokeless tobacco. She reports that she does not drink alcohol and does not use drugs.  ? ?Family History:  ?Her family history includes Cancer in her mother; Dementia in her paternal grandmother; Depression in her sister; Heart disease in her maternal grandmother and mother.  ? ?Allergies ?Allergies  ?Allergen Reactions  ? Asa [Aspirin] Nausea Only  ? Penicillins Rash  ?  ? ?Home Medications  ?Prior to Admission medications   ?Medication Sig Start Date End Date Taking? Authorizing  Provider  ?amphetamine-dextroamphetamine (ADDERALL) 30 MG tablet Take 30 mg by mouth 2 (two) times daily. 10/09/21  Yes [provider]  ?D3-50 1.25 MG (50000 UT) capsule Take 50,000 Units by mouth once a week. Tuesday/Wednesday 10/03/21  Yes [provider]  ?DULoxetine (CYMBALTA) 30 MG capsule Take 30 mg by mouth 3 (three) times daily.    Yes [provider]  ?ondansetron (ZOFRAN) 4 MG tablet Take 1 tablet (4 mg total) by mouth every 8 (eight) hours as needed for nausea or vomiting. 10/23/21  Yes Arthor CaptainHarris, Abigail, PA-C  ?oxyCODONE-acetaminophen (PERCOCET) 5-325 MG tablet Take 1-2 tablets by mouth every 6 (six) hours as needed. ?Patient taking differently: Take 1-2 tablets by mouth every 6 (six) hours as needed for moderate pain. 10/23/21  Yes Arthor CaptainHarris, Abigail, PA-C  ?tamsulosin (FLOMAX) 0.4 MG CAPS capsule Take 1 capsule (  0.4 mg total) by mouth 2 (two) times daily. 10/23/21  Yes Arthor Captain, PA-C  ?traZODone (DESYREL) 50 MG tablet Take 50 mg by mouth at bedtime as needed for sleep. 10/15/21  Yes [provider]  ?zolpidem (AMBIEN) 10 MG tablet Take 5 mg by mouth at bedtime as needed for sleep.   Yes [provider]  ?celecoxib (CELEBREX) 200 MG capsule Take 1 capsule (200 mg total) by mouth 2 (two) times daily. ?Patient not taking: Reported on 10/24/2021 10/23/21   Arthor Captain, PA-C  ?LORazepam (ATIVAN) 1 MG tablet Take 1 mg by mouth every 6 (six) hours as needed. anxiety ?Patient not taking: Reported on 10/24/2021    [provider]  ?  ? ?Critical care time: 40 mins  ?Novaleigh Kohlman D. Harris, NP-C ?[facility] ?Personal contact information can be found on Amion  ?10/24/2021, 9:24 AM ? ? ? ? ? ? ?

## 2021-10-24 NOTE — Progress Notes (Signed)
An USGPIV (ultrasound guided PIV) has been placed for short-term vasopressor infusion. A correctly placed ivWatch must be used when administering Vasopressors. Should this treatment be needed beyond 72 hours, central line access should be obtained.  It will be the responsibility of the bedside nurse to follow best practice to prevent extravasations.   ?

## 2021-10-24 NOTE — ED Notes (Addendum)
Pt was bladder scanned and showed 33ml, but pt continued to complain of pressure. RN in and out cathed her and 59ml came out and no more. Pt repositioned and still no return. Pt reports she has had 8 24 oz water bottles.  ?

## 2021-10-24 NOTE — Anesthesia Procedure Notes (Signed)
Procedure Name: LMA Insertion ?Date/Time: 10/24/2021 7:40 AM ?Performed by: Theodosia Quay, CRNA ?Pre-anesthesia Checklist: Patient identified, Emergency Drugs available, Suction available and Patient being monitored ?Patient Re-evaluated:Patient Re-evaluated prior to induction ?Oxygen Delivery Method: Circle System Utilized ?Preoxygenation: Pre-oxygenation with 100% oxygen ?Induction Type: IV induction ?Ventilation: Mask ventilation without difficulty ?LMA: LMA inserted ?LMA Size: 4.0 ?Number of attempts: 1 ?Airway Equipment and Method: Bite block ?Placement Confirmation: positive ETCO2 ?Tube secured with: Tape ?Dental Injury: Teeth and Oropharynx as per pre-operative assessment  ? ? ? ? ?

## 2021-10-24 NOTE — Progress Notes (Signed)
IVT arrived to assess for u/s guided PIV.  Pt actively being transported to new room.  Will allow for time for transport and meet pt in room. ?

## 2021-10-24 NOTE — Anesthesia Postprocedure Evaluation (Signed)
Anesthesia Post Note ? ?Patient: Karen Rhodes ? ?Procedure(s) Performed: CYSTOSCOPY WITh retrograde AND STENT PLACEMENT (Left: Ureter) ? ?  ? ?Patient location during evaluation: PACU ?Anesthesia Type: General ?Level of consciousness: awake and alert ?Pain management: pain level controlled ?Vital Signs Assessment: post-procedure vital signs reviewed and stable ?Respiratory status: spontaneous breathing, nonlabored ventilation, respiratory function stable and patient connected to nasal cannula oxygen ?Cardiovascular status: blood pressure returned to baseline and stable ?Postop Assessment: no apparent nausea or vomiting ?Anesthetic complications: no ? ? ?No notable events documented. ? ?Last Vitals:  ?Vitals:  ? 10/24/21 1045 10/24/21 1050  ?BP: (!) 112/49 (!) 108/55  ?Pulse: (!) 103 (!) 104  ?Resp: 20 18  ?Temp:    ?SpO2: 96% 96%  ?  ?Last Pain:  ?Vitals:  ? 10/24/21 0921  ?TempSrc: Oral  ?PainSc:   ? ? ?  ?  ?  ?  ?  ?  ? ?Aking Klabunde S ? ? ? ? ?

## 2021-10-24 NOTE — Transfer of Care (Signed)
Immediate Anesthesia Transfer of Care Note ? ?Patient: Karen Rhodes ? ?Procedure(s) Performed: Procedure(s): ?CYSTOSCOPY WITh retrograde AND STENT PLACEMENT (Left) ? ?Patient Location: PACU ? ?Anesthesia Type:General ? ?Level of Consciousness:  sedated, patient cooperative and responds to stimulation ? ?Airway & Oxygen Therapy:Patient Spontanous Breathing and Patient connected to face mask oxgen ? ?Post-op Assessment:  Report given to PACU RN and Post -op Vital signs reviewed and stable ? ?Post vital signs:  Reviewed and stable ? ?Last Vitals:  ?Vitals:  ? 10/24/21 0745 10/24/21 0800  ?BP: (!) 91/52   ?Pulse: (!) 102 100  ?Resp: 19 (!) 23  ?Temp: 36.9 ?C   ?SpO2: 95% 91%  ? ? ?Complications: No apparent anesthesia complications ? ?

## 2021-10-24 NOTE — Op Note (Signed)
Preoperative diagnosis: Probable infected left ureteral calculus/calculi with sepsis ? ?Postop diagnosis: Same ? ?Principal procedure: Cystoscopy, left retrograde ureteropyelogram, fluoroscopic interpretation, placement of 6 French by 24 cm contour double-J stent without tether ? ?Surgeon: Retta Diones ? ?Anesthesia: General with LMA ? ?Complications: None ? ?Specimen: Urine for culture ? ?Indications: 61 year old female without prior urologic history is taken to the operating room with an elevated lactic acid as well as hypotension/tachycardia.  She represented to the emergency room this morning following previous visit yesterday for stone.  She was hypotensive in the emergency room, tachycardic and had a lactic acid of 3.  She was feeling bad with left flank pain and weakness.  Urologic consultation was requested, and she presents at this time for urgent stent placement for obstructing left upper ureteral calculi with the above findings. ? ?Findings: Bladder was normal.  Ureteral orifices were normal in location and configuration.  Retrograde study of the left ureter revealed a normal distal and mid ureter with probable filling defects in the proximal ureter with pyelocalyceal dilatation above that. ? ?Description procedure: The patient was properly identified and marked preoperatively.  She received Rocephin.  She was taken to the operating room where general anesthetic was administered with the LMA.  She was placed in the dorsolithotomy position.  Genitalia and perineum were prepped, draped proper timeout performed. ? ?61 French panendoscope advanced into the bladder.  Circumferential inspection was performed.  The left ureteral orifice was cannulated with a 6 [place] open-ended catheter and using Omnipaque retrograde ureteropyelogram was performed.  Above-mentioned findings were noted.  Following this, sensor tip guidewire advanced through the open-ended catheter, easily into the upper pole calyceal system.   Open-ended catheter was removed and a 24 cm x 6 French double-J stent was advanced over the guidewire.  Once adequately positioned the guidewire was removed and it was deployed with excellent proximal and distal curl seen with fluoroscopy and cystoscopy, respectively.  Urine was then sent for culture.  16 French Foley catheter was then placed, balloon filled with 10 cc of water.  Hooked to dependent drainage.  At this point the procedure was terminated. ? ?The patient was awakened, taken to the PACU in stable condition.  She was hypotensive during the case and pressure was supported with pressors. ?

## 2021-10-24 NOTE — ED Triage Notes (Signed)
Pt was recently seen for left flank pain and continues to be in pain. Pt reports not urinating all day.  ?

## 2021-10-24 NOTE — ED Notes (Signed)
Pt's oxygen level dropped to 88%, placed on [facility], provider made aware ?

## 2021-10-24 NOTE — ED Provider Notes (Signed)
?[facility]-EMERGENCY DEPT ?Provider Note ? ? ?CSN: [id number] ?Arrival date & time: 10/24/21  0040 ? ?  ? ?History ? ?Chief Complaint  ?Patient presents with  ? Flank Pain  ? Urinary Retention  ? ? ?Karen Rhodes is a 61 y.o. female. ? ?Pt notes recent dx ureteral stone on left, and states has not urinated normal amount today, and persistent pain left lower flank. No fever or chills. Nausea, decreased appetite/decreased po intake earlier. Is drinking fluids. No abd distension. Is having bms.  ? ?The history is provided by the patient and medical records.  ?Flank Pain ?Pertinent negatives include no chest pain, no abdominal pain, no headaches and no shortness of breath.  ? ?  ? ?Home Medications ?Prior to Admission medications   ?Medication Sig Start Date End Date Taking? Authorizing Provider  ?celecoxib (CELEBREX) 200 MG capsule Take 1 capsule (200 mg total) by mouth 2 (two) times daily. 10/23/21   Arthor Captain, PA-C  ?cholecalciferol (VITAMIN D) 1000 UNITS tablet Take 1,000 Units by mouth daily.    [provider]  ?cyclobenzaprine (FLEXERIL) 5 MG tablet Take 1 tablet (5 mg total) by mouth at bedtime as needed for muscle spasms. 07/18/13   Elvina Sidle, MD  ?diazepam (VALIUM) 5 MG tablet Take 2 tablets (10 mg total) by mouth every 8 (eight) hours as needed for anxiety. 07/11/12   Gwyneth Sprout, MD  ?DULoxetine (CYMBALTA) 30 MG capsule Take 30 mg by mouth 3 (three) times daily.     [provider]  ?FLUoxetine (PROZAC) 20 MG capsule Take 20 mg by mouth daily.    [provider]  ?LORazepam (ATIVAN) 1 MG tablet Take 1 mg by mouth every 6 (six) hours as needed. anxiety    [provider]  ?meloxicam (MOBIC) 7.5 MG tablet Take one with breakfast for pain and inflammation and shoulder. If well tolerated increase to twice daily with food if needed 05/19/13   Peyton Najjar, MD  ?methocarbamol (ROBAXIN-750) 750 MG tablet Take one in the morning, one in the  afternoon, and 2 at night for muscle relaxant 05/19/13   Peyton Najjar, MD  ?ondansetron (ZOFRAN ODT) 4 MG disintegrating tablet Take 1 tablet (4 mg total) by mouth every 8 (eight) hours as needed for nausea or vomiting. 09/16/19   Lamptey, Britta Mccreedy, MD  ?ondansetron (ZOFRAN) 4 MG tablet Take 1 tablet (4 mg total) by mouth every 8 (eight) hours as needed for nausea or vomiting. 10/23/21   Arthor Captain, PA-C  ?oxyCODONE-acetaminophen (PERCOCET) 5-325 MG tablet Take 1-2 tablets by mouth every 6 (six) hours as needed. 10/23/21   Arthor Captain, PA-C  ?pantoprazole (PROTONIX) 20 MG tablet Take 1 tablet (20 mg total) by mouth 2 (two) times daily. 09/16/19 10/16/19  Merrilee Jansky, MD  ?tamsulosin (FLOMAX) 0.4 MG CAPS capsule Take 1 capsule (0.4 mg total) by mouth 2 (two) times daily. 10/23/21   Arthor Captain, PA-C  ?zolpidem (AMBIEN) 10 MG tablet Take 5 mg by mouth at bedtime as needed. sleep    [provider]  ?amphetamine-dextroamphetamine (ADDERALL) 20 MG tablet Take 20 mg by mouth 3 (three) times daily.  09/16/19  [provider]  ?   ? ?Allergies    ?Asa [aspirin] and Penicillins   ? ?Review of Systems   ?Review of Systems  ?Constitutional:  Negative for chills and fever.  ?HENT:  Negative for sore throat.   ?Eyes:  Negative for redness.  ?Respiratory:  Negative for  shortness of breath.   ?Cardiovascular:  Negative for chest pain.  ?Gastrointestinal:  Negative for abdominal pain.  ?Genitourinary:  Positive for flank pain. Negative for dysuria.  ?Musculoskeletal:  Negative for neck pain.  ?Skin:  Negative for rash.  ?Neurological:  Negative for headaches.  ?Hematological:  Does not bruise/bleed easily.  ?Psychiatric/Behavioral:  Negative for confusion.   ? ?Physical Exam ?Updated Vital Signs ?BP (!) 101/54   Pulse (!) 126   Temp 98.1 ?F (36.7 ?C) (Oral)   Resp 20   SpO2 96%  ?Physical Exam ?Vitals and nursing note reviewed.  ?Constitutional:   ?   Appearance: Normal appearance. She is  well-developed.  ?HENT:  ?   Head: Atraumatic.  ?   Nose: Nose normal.  ?   Mouth/Throat:  ?   Mouth: Mucous membranes are moist.  ?Eyes:  ?   General: No scleral icterus. ?   Conjunctiva/sclera: Conjunctivae normal.  ?Neck:  ?   Trachea: No tracheal deviation.  ?Cardiovascular:  ?   Rate and Rhythm: Normal rate and regular rhythm.  ?   Pulses: Normal pulses.  ?   Heart sounds: Normal heart sounds. No murmur heard. ?  No friction rub. No gallop.  ?Pulmonary:  ?   Effort: Pulmonary effort is normal. No respiratory distress.  ?   Breath sounds: Normal breath sounds.  ?Abdominal:  ?   General: Bowel sounds are normal. There is no distension.  ?   Palpations: Abdomen is soft.  ?   Tenderness: There is no abdominal tenderness.  ?Genitourinary: ?   Comments: No cva tenderness.  ?Musculoskeletal:     ?   General: No swelling or tenderness.  ?   Cervical back: Normal range of motion and neck supple. No rigidity. No muscular tenderness.  ?   Right lower leg: No edema.  ?   Left lower leg: No edema.  ?Skin: ?   General: Skin is warm and dry.  ?   Findings: No rash.  ?Neurological:  ?   Mental Status: She is alert.  ?   Comments: Alert, speech normal.   ?Psychiatric:     ?   Mood and Affect: Mood normal.  ? ? ?ED Results / Procedures / Treatments   ?Labs ?(all labs ordered are listed, but only abnormal results are displayed) ?Results for orders placed or performed during the hospital encounter of 10/24/21  ?CBC  ?Result Value Ref Range  ? WBC 14.2 (H) 4.0 - 10.5 K/uL  ? RBC 4.10 3.87 - 5.11 MIL/uL  ? Hemoglobin 12.5 12.0 - 15.0 g/dL  ? HCT 37.6 36.0 - 46.0 %  ? MCV 91.7 80.0 - 100.0 fL  ? MCH 30.5 26.0 - 34.0 pg  ? MCHC 33.2 30.0 - 36.0 g/dL  ? RDW 13.8 11.5 - 15.5 %  ? Platelets 235 150 - 400 K/uL  ? nRBC 0.0 0.0 - 0.2 %  ?Basic metabolic panel  ?Result Value Ref Range  ? Sodium 128 (L) 135 - 145 mmol/L  ? Potassium 3.8 3.5 - 5.1 mmol/L  ? Chloride 99 98 - 111 mmol/L  ? CO2 18 (L) 22 - 32 mmol/L  ? Glucose, Bld 96 70 - 99  mg/dL  ? BUN QUANTITY NOT SUFFICIENT, UNABLE TO PERFORM TEST 6 - 20 mg/dL  ? Creatinine, Ser 1.68 (H) 0.44 - 1.00 mg/dL  ? Calcium 8.6 (L) 8.9 - 10.3 mg/dL  ? GFR, Estimated 35 (L) >60 mL/min  ? Anion gap 11 5 -  15  ?Urinalysis, Routine w reflex microscopic Urine, In & Out Cath  ?Result Value Ref Range  ? Color, Urine AMBER (A) YELLOW  ? APPearance HAZY (A) CLEAR  ? Specific Gravity, Urine 1.019 1.005 - 1.030  ? pH 5.0 5.0 - 8.0  ? Glucose, UA NEGATIVE NEGATIVE mg/dL  ? Hgb urine dipstick SMALL (A) NEGATIVE  ? Bilirubin Urine NEGATIVE NEGATIVE  ? Ketones, ur NEGATIVE NEGATIVE mg/dL  ? Protein, ur 30 (A) NEGATIVE mg/dL  ? Nitrite NEGATIVE NEGATIVE  ? Leukocytes,Ua SMALL (A) NEGATIVE  ? RBC / HPF 0-5 0 - 5 RBC/hpf  ? WBC, UA 11-20 0 - 5 WBC/hpf  ? Bacteria, UA RARE (A) NONE SEEN  ? Squamous Epithelial / LPF 6-10 0 - 5  ? Mucus PRESENT   ?BUN  ?Result Value Ref Range  ? BUN 24 (H) 6 - 20 mg/dL  ? ?CT RENAL STONE STUDY ? ?Result Date: 10/23/2021 ?CLINICAL DATA:  Flank pain since this a.m., leukocytosis, EXAM: CT ABDOMEN AND PELVIS WITHOUT CONTRAST TECHNIQUE: Multidetector CT imaging of the abdomen and pelvis was performed following the standard protocol without IV contrast. RADIATION DOSE REDUCTION: This exam was performed according to the departmental dose-optimization program which includes automated exposure control, adjustment of the mA and/or kV according to patient size and/or use of iterative reconstruction technique. COMPARISON:  CT June 06, 2009 FINDINGS: Lower chest: No acute abnormality. Hepatobiliary: Unremarkable noncontrast appearance of the hepatic parenchyma. Gallbladder surgically absent. No biliary ductal dilation. Pancreas: No pancreatic ductal dilation or evidence of acute inflammation. Spleen: No splenomegaly or focal splenic lesion. Adrenals/Urinary Tract: Bilateral adrenal glands appear normal. Edematous appearance of the left kidney with perinephric stranding and hydronephrosis to the level of  a few stacked stones at the ureteropelvic junction measuring up to 3 mm. No right-sided hydronephrosis. No additional nephrolithiasis identified. Urinary bladder is unremarkable for degree of distension. Stomach/B

## 2021-10-24 NOTE — Anesthesia Preprocedure Evaluation (Signed)
Anesthesia Evaluation  ?Patient identified by MRN, date of birth, ID band ?Patient awake ? ?General Assessment Comment:urosepsis ? ?Reviewed: ?Allergy & Precautions, NPO status , Patient's Chart, lab work & pertinent test results ? ?Airway ?Mallampati: III ? ?TM Distance: <3 FB ?Neck ROM: Full ? ? ? Dental ?no notable dental hx. ? ?  ?Pulmonary ?Current Smoker and Patient abstained from smoking.,  ?  ?Pulmonary exam normal ?breath sounds clear to auscultation ? ? ? ? ? ? Cardiovascular ?negative cardio ROS ?Normal cardiovascular exam ?Rhythm:Regular Rate:Normal ? ? ?  ?Neuro/Psych ?negative neurological ROS ? negative psych ROS  ? GI/Hepatic ?negative GI ROS, Neg liver ROS,   ?Endo/Other  ?negative endocrine ROS ? Renal/GU ?Renal InsufficiencyRenal disease  ?negative genitourinary ?  ?Musculoskeletal ?negative musculoskeletal ROS ?(+)  ? Abdominal ?  ?Peds ?negative pediatric ROS ?(+)  Hematology ?negative hematology ROS ?(+)   ?Anesthesia Other Findings ? ? Reproductive/Obstetrics ?negative OB ROS ? ?  ? ? ? ? ? ? ? ? ? ? ? ? ? ?  ?  ? ? ? ? ? ? ? ? ?Anesthesia Physical ?Anesthesia Plan ? ?ASA: 2 ? ?Anesthesia Plan: General  ? ?Post-op Pain Management: Minimal or no pain anticipated  ? ?Induction: Intravenous ? ?PONV Risk Score and Plan: 2 and Ondansetron, Dexamethasone and Treatment may vary due to age or medical condition ? ?Airway Management Planned: LMA ? ?Additional Equipment:  ? ?Intra-op Plan:  ? ?Post-operative Plan: Extubation in OR ? ?Informed Consent: I have reviewed the patients History and Physical, chart, labs and discussed the procedure including the risks, benefits and alternatives for the proposed anesthesia with the patient or authorized representative who has indicated his/her understanding and acceptance.  ? ? ? ?Dental advisory given ? ?Plan Discussed with: CRNA and Surgeon ? ?Anesthesia Plan Comments:   ? ? ? ? ? ? ?Anesthesia Quick Evaluation ? ?

## 2021-10-24 NOTE — Progress Notes (Signed)
?  Transition of Care (TOC) Screening Note ? ? ?Patient Details  ?Name: Karen Rhodes ?Date of Birth: 09/09/1960 ? ? ?Transition of Care (TOC) CM/SW Contact:    ?Ruthell Feigenbaum, LCSW ?Phone Number: ?10/24/2021, 9:46 AM ? ? ? ?Transition of Care Department [facility]) has reviewed patient and no TOC needs have been identified at this time. We will continue to monitor patient advancement through interdisciplinary progression rounds. If new patient transition needs arise, please place a TOC consult. ? ? ?

## 2021-10-24 NOTE — ED Notes (Addendum)
Date and time results received: 10/24/21 5:52 AM  ? ?Test: Latic acid ?Critical Value: 3.0 ? ?Name of Provider Notified: Denton Lank  ? ?Orders Received? Or Actions Taken?: Orders Received - See Orders for details ?

## 2021-10-24 NOTE — H&P (Signed)
Urology Consult  ? ?Physician requesting consult: Steinl ? ?Reason for consult: Left-sided kidney stones, hypotension ? ?History of Present Illness: Karen Rhodes is a 61 y.o. female with a 4-day history of kidney stones.  This past Sunday she began having left-sided discomfort and chills.  She made it through a couple of days but presented to the emergency room early yesterday morning with left flank pain.  She was diagnosed with left upper ureteral calculi-CT revealed several stones in her left proximal ureter.  She was sent home after she was made comfortable.  She presented again today with worsened symptoms-she states that she drank 816 ounce glasses of water and has not voided since that time.  She has weakness and presented to the emergency room with hypotension.  She is also tachycardic.  Lactic acid is 3.  She did have a few white cells and bacteria in her urine.  Urologic consultation is requested. ? ?She denies prior urologic history.  She does not have a PCP.  She is on Adderall and an SSRI. ? ? ?Past Medical History:  ?Diagnosis Date  ? Allergy   ? Anxiety   ? Depression   ? Ulcer   ? ? ?Past Surgical History:  ?Procedure Laterality Date  ? ABDOMINAL HYSTERECTOMY    ? CHOLECYSTECTOMY    ? TUBAL LIGATION    ? ? ? ?Current Hospital Medications: ?Scheduled Meds: ? HYDROmorphone      ? methylPREDNISolone acetate  80 mg Intramuscular Once  ? ?Continuous Infusions: ?PRN Meds:. ? ?Allergies:  ?Allergies  ?Allergen Reactions  ? Asa [Aspirin] Nausea Only  ? Penicillins Rash  ? ? ?Family History  ?Problem Relation Age of Onset  ? Heart disease Mother   ? Cancer Mother   ? Depression Sister   ? Heart disease Maternal Grandmother   ? Dementia Paternal Grandmother   ? ? ?Social History:  reports that she has been smoking cigarettes. She has never used smokeless tobacco. She reports that she does not drink alcohol and does not use drugs. ? ?ROS: ?A complete review of systems was performed.  All systems are negative  except for pertinent findings as noted. ? ?Physical Exam:  ?Vital signs in last 24 hours: ?Temp:  [97.6 ?F (36.4 ?C)-98.4 ?F (36.9 ?C)] 98.1 ?F (36.7 ?C) (04/27 0045) ?Pulse Rate:  [75-126] 106 (04/27 0600) ?Resp:  [15-22] 19 (04/27 0600) ?BP: (73-158)/(34-92) 73/34 (04/27 0600) ?SpO2:  [88 %-100 %] 94 % (04/27 0600) ?Weight:  [72.6 kg] 72.6 kg (04/26 0630) ?General:  Alert and oriented, No acute distress ?HEENT: Normocephalic, atraumatic ?Neck: No JVD or lymphadenopathy ?Cardiovascular: Tachycardic ?Lungs: Normal inspiratory/expiratory excursion ?Extremities: No edema ?Neurologic: Grossly intact ? ?Laboratory Data:  ?Recent Labs  ?  10/23/21 ?0625 10/24/21 ?0115  ?WBC 11.3* 14.2*  ?HGB 14.0 12.5  ?HCT 43.2 37.6  ?PLT 402* 235  ? ? ?Recent Labs  ?  10/23/21 ?0625 10/24/21 ?0115 10/24/21 ?0253  ?NA 137 128*  --   ?K 3.4* 3.8  --   ?CL 103 99  --   ?GLUCOSE 133* 96  --   ?BUN 14 QUANTITY NOT SUFFICIENT, UNABLE TO PERFORM TEST 24*  ?CALCIUM 9.1 8.6*  --   ?CREATININE 0.83 1.68*  --   ? ? ? ?Results for orders placed or performed during the hospital encounter of 10/24/21 (from the past 24 hour(s))  ?Urinalysis, Routine w reflex microscopic Urine, In & Out Cath     Status: Abnormal  ? Collection Time: 10/24/21  1:10 AM  ?Result Value Ref Range  ? Color, Urine AMBER (A) YELLOW  ? APPearance HAZY (A) CLEAR  ? Specific Gravity, Urine 1.019 1.005 - 1.030  ? pH 5.0 5.0 - 8.0  ? Glucose, UA NEGATIVE NEGATIVE mg/dL  ? Hgb urine dipstick SMALL (A) NEGATIVE  ? Bilirubin Urine NEGATIVE NEGATIVE  ? Ketones, ur NEGATIVE NEGATIVE mg/dL  ? Protein, ur 30 (A) NEGATIVE mg/dL  ? Nitrite NEGATIVE NEGATIVE  ? Leukocytes,Ua SMALL (A) NEGATIVE  ? RBC / HPF 0-5 0 - 5 RBC/hpf  ? WBC, UA 11-20 0 - 5 WBC/hpf  ? Bacteria, UA RARE (A) NONE SEEN  ? Squamous Epithelial / LPF 6-10 0 - 5  ? Mucus PRESENT   ?CBC     Status: Abnormal  ? Collection Time: 10/24/21  1:15 AM  ?Result Value Ref Range  ? WBC 14.2 (H) 4.0 - 10.5 K/uL  ? RBC 4.10 3.87 - 5.11  MIL/uL  ? Hemoglobin 12.5 12.0 - 15.0 g/dL  ? HCT 37.6 36.0 - 46.0 %  ? MCV 91.7 80.0 - 100.0 fL  ? MCH 30.5 26.0 - 34.0 pg  ? MCHC 33.2 30.0 - 36.0 g/dL  ? RDW 13.8 11.5 - 15.5 %  ? Platelets 235 150 - 400 K/uL  ? nRBC 0.0 0.0 - 0.2 %  ?Basic metabolic panel     Status: Abnormal  ? Collection Time: 10/24/21  1:15 AM  ?Result Value Ref Range  ? Sodium 128 (L) 135 - 145 mmol/L  ? Potassium 3.8 3.5 - 5.1 mmol/L  ? Chloride 99 98 - 111 mmol/L  ? CO2 18 (L) 22 - 32 mmol/L  ? Glucose, Bld 96 70 - 99 mg/dL  ? BUN QUANTITY NOT SUFFICIENT, UNABLE TO PERFORM TEST 6 - 20 mg/dL  ? Creatinine, Ser 1.68 (H) 0.44 - 1.00 mg/dL  ? Calcium 8.6 (L) 8.9 - 10.3 mg/dL  ? GFR, Estimated 35 (L) >60 mL/min  ? Anion gap 11 5 - 15  ?BUN     Status: Abnormal  ? Collection Time: 10/24/21  2:53 AM  ?Result Value Ref Range  ? BUN 24 (H) 6 - 20 mg/dL  ?Lactic acid, plasma     Status: Abnormal  ? Collection Time: 10/24/21  5:16 AM  ?Result Value Ref Range  ? Lactic Acid, Venous 3.0 (HH) 0.5 - 1.9 mmol/L  ? ?No results found for this or any previous visit (from the past 240 hour(s)). ? ?Renal Function: ?Recent Labs  ?  10/23/21 ?0625 10/24/21 ?0115  ?CREATININE 0.83 1.68*  ? ?Estimated Creatinine Clearance: 35.7 mL/min (A) (by C-G formula based on SCr of 1.68 mg/dL (H)). ? ?Radiologic Imaging: ?CT RENAL STONE STUDY ? ?Result Date: 10/23/2021 ?CLINICAL DATA:  Flank pain since this a.m., leukocytosis, EXAM: CT ABDOMEN AND PELVIS WITHOUT CONTRAST TECHNIQUE: Multidetector CT imaging of the abdomen and pelvis was performed following the standard protocol without IV contrast. RADIATION DOSE REDUCTION: This exam was performed according to the departmental dose-optimization program which includes automated exposure control, adjustment of the mA and/or kV according to patient size and/or use of iterative reconstruction technique. COMPARISON:  CT June 06, 2009 FINDINGS: Lower chest: No acute abnormality. Hepatobiliary: Unremarkable noncontrast appearance  of the hepatic parenchyma. Gallbladder surgically absent. No biliary ductal dilation. Pancreas: No pancreatic ductal dilation or evidence of acute inflammation. Spleen: No splenomegaly or focal splenic lesion. Adrenals/Urinary Tract: Bilateral adrenal glands appear normal. Edematous appearance of the left kidney with perinephric stranding and hydronephrosis to  the level of a few stacked stones at the ureteropelvic junction measuring up to 3 mm. No right-sided hydronephrosis. No additional nephrolithiasis identified. Urinary bladder is unremarkable for degree of distension. Stomach/Bowel: Stomach is distended with ingested material without focal wall thickening. No pathologic dilation of small or large bowel. Normal appendix and terminal ileum. There is some mural fatty infiltration along the proximal ascending colon, commonly reflecting sequela of chronic inflammation. No evidence of acute bowel inflammation. A few scattered left-sided colonic diverticula without findings of acute diverticulitis. Vascular/Lymphatic: Aortic and branch vessel atherosclerosis without abdominal aortic aneurysm. No pathologically enlarged abdominal or pelvic lymph nodes. Reproductive: Status post hysterectomy. No adnexal masses. Other: No significant abdominopelvic free fluid. Musculoskeletal: L5-S1 discogenic disease. IMPRESSION: 1. Obstructing 3 mm stones at the left ureteropelvic junction resulting in mild to moderate left-sided hydronephrosis. 2. Colonic diverticulosis without findings of acute diverticulitis. 3.  Aortic Atherosclerosis (ICD10-I70.0). Electronically Signed   By: Maudry MayhewJeffrey  Waltz M.D.   On: 10/23/2021 07:53   ? ?I independently reviewed the above imaging studies. ? ?Impression/Assessment:  ?Left upper ureteral stone, possible UTI, probable sepsis with AKI ? ?Plan:  ?I have discussed urgent management with the patient and her husband who is at her bedside.  I think she should be admitted after urgent stenting of the left  kidney.  I have discussed this procedure with her and the fact that this is not a curative procedure but she will need further management of her stones once her medical condition stabilizes and and the i

## 2021-10-25 ENCOUNTER — Encounter (HOSPITAL_COMMUNITY): Payer: Self-pay | Admitting: Urology

## 2021-10-25 DIAGNOSIS — A419 Sepsis, unspecified organism: Secondary | ICD-10-CM | POA: Diagnosis not present

## 2021-10-25 DIAGNOSIS — N179 Acute kidney failure, unspecified: Secondary | ICD-10-CM

## 2021-10-25 DIAGNOSIS — R6520 Severe sepsis without septic shock: Secondary | ICD-10-CM | POA: Diagnosis not present

## 2021-10-25 LAB — CBC
HCT: 34.5 % — ABNORMAL LOW (ref 36.0–46.0)
Hemoglobin: 10.7 g/dL — ABNORMAL LOW (ref 12.0–15.0)
MCH: 29.5 pg (ref 26.0–34.0)
MCHC: 31 g/dL (ref 30.0–36.0)
MCV: 95 fL (ref 80.0–100.0)
Platelets: 144 10*3/uL — ABNORMAL LOW (ref 150–400)
RBC: 3.63 MIL/uL — ABNORMAL LOW (ref 3.87–5.11)
RDW: 14.2 % (ref 11.5–15.5)
WBC: 18.2 10*3/uL — ABNORMAL HIGH (ref 4.0–10.5)
nRBC: 0 % (ref 0.0–0.2)

## 2021-10-25 LAB — URINE CULTURE: Culture: <10000 — AB

## 2021-10-25 LAB — BASIC METABOLIC PANEL
Anion gap: 6 (ref 5–15)
BUN: 30 mg/dL — ABNORMAL HIGH (ref 6–20)
CO2: 22 mmol/L (ref 22–32)
Calcium: 8.4 mg/dL — ABNORMAL LOW (ref 8.9–10.3)
Chloride: 104 mmol/L (ref 98–111)
Creatinine, Ser: 1.47 mg/dL — ABNORMAL HIGH (ref 0.44–1.00)
GFR, Estimated: 41 mL/min — ABNORMAL LOW (ref >60)
Glucose, Bld: 109 mg/dL — ABNORMAL HIGH (ref 70–99)
Potassium: 5 mmol/L (ref 3.5–5.1)
Sodium: 132 mmol/L — ABNORMAL LOW (ref 135–145)

## 2021-10-25 LAB — MAGNESIUM: Magnesium: 2.5 mg/dL — ABNORMAL HIGH (ref 1.7–2.4)

## 2021-10-25 LAB — PHOSPHORUS: Phosphorus: 3.2 mg/dL (ref 2.5–4.6)

## 2021-10-25 MED ORDER — DULOXETINE HCL 30 MG PO CPEP
30.0000 mg | ORAL_CAPSULE | Freq: Three times a day (TID) | ORAL | Status: DC
  Administered 2021-10-25 – 2021-10-28 (×12): 30 mg via ORAL
  Filled 2021-10-25 (×12): qty 1

## 2021-10-25 MED ORDER — CALCIUM CARBONATE ANTACID 500 MG PO CHEW
400.0000 mg | CHEWABLE_TABLET | Freq: Three times a day (TID) | ORAL | Status: DC | PRN
  Administered 2021-10-25: 400 mg via ORAL
  Filled 2021-10-25: qty 2

## 2021-10-25 MED ORDER — TAMSULOSIN HCL 0.4 MG PO CAPS
0.4000 mg | ORAL_CAPSULE | Freq: Two times a day (BID) | ORAL | Status: DC
  Administered 2021-10-25 – 2021-10-29 (×9): 0.4 mg via ORAL
  Filled 2021-10-25 (×9): qty 1

## 2021-10-25 NOTE — Progress Notes (Signed)
? ?NAME:  Karen Rhodes, MRN:  [id number], DOB:  Apr 15, 1961, LOS: 1 ?ADMISSION DATE:  10/24/2021, CONSULTATION DATE:  10/24/2021 ?REFERRING MD:  Dr. Retta Diones, CHIEF COMPLAINT:  Sepsis with hypotension  ? ?History of Present Illness:  ?Karen Rhodes is a 61yo female with PMH significant for renal stones, anxiety, and depression who presented to the ED for complaints of not being able to urinated with associated left lower flank pain. Left flank pain began 4 days prior to admi Denies any other complaints no fever, chills, nausea, or decreased appetite, ? ?On ED arrival patient was seen with tachycardia, mild tachypnea, and borderline hypotension. Lab work reveled NA 128, creatinine 1.68, GFR 35, lactic 3.0, and WBC 14.2. CT renal study was preformed and showed obstructing 5mm stones to the left ureteropelvic junction ?resulting in mild to moderate left-sided hydronephrosis.. Urology was consulted and decided to urgently stent the left kidney. Post procedure patient remained hypotensive despite IV hydration prompting initiation of vasopressors and consult to PCCM.  ? ?Pertinent  Medical History  ?Renal stones, anxiety, and depression ? ?Significant Hospital Events: ?Including procedures, antibiotic start and stop dates in addition to other pertinent events   ?4/27 presented with left flank pain and inability to urinate, take urgently for left renal stent secondary to obstructing stone  ?4/28 Off pressors this am  ? ?Interim History / Subjective:  ?Seen sitting up in bed with no acute complaints, spouse at bedside and updated  ? ?Objective   ?Blood pressure (!) 113/54, pulse 71, temperature (!) 96.8 ?F (36 ?C), temperature source Axillary, resp. rate 13, height 5\' 4"  (1.626 m), weight 72.6 kg, SpO2 93 %. ?   ?   ? ?Intake/Output Summary (Last 24 hours) at 10/25/2021 0700 ?Last data filed at 10/25/2021 0537 ?Gross per 24 hour  ?Intake 3993.09 ml  ?Output 2650 ml  ?Net 1343.09 ml  ? ? ?Filed Weights  ? 10/24/21 0654 10/24/21  0657  ?Weight: 72.6 kg 72.6 kg  ? ? ?Examination: ?General: Well appearing adult female lying in bed in NAD ?HEENT: [place]/AT, MM pink/moist, PERRL,  ?Neuro: Alert and oriented x3, non-focal  ?CV: s1s2 regular rate and rhythm, no murmur, rubs, or gallops,  ?PULM:  Clear to ascultation, no increased work of breathing, no added breath sounds  ?GI: soft, bowel sounds active in all 4 quadrants, non-tender, non-distended, tolerating TF ?Extremities: warm/dry, no edema  ?Skin: no rashes or lesions ? ?Resolved Hospital Problem list   ? ? ?Assessment & Plan:  ?Severe sepsis as evidence by elevated lactic acid, leukocytosis, acute kidney injury, tachycardia, and hypotension  ?-Lactic acid on arrival 3 ?P: ?Stable for transfer out of ICU  ?Continue supplemented oxygen as needed ?Off pressors as of this am  ?Continue IV Ceftriaxone  ?Monitor urine output   ? ?Left obstructing renal stone resulting in hydronephrosis  ?-S/P left renal stent per Dr. 10/26/21 4/27 ?P: ?Primary management per Urology  ?Follow cultures  ?Pain control  ? ?Acute Kidney Injury  - improving  ?-in the setting of obstructing renal stone. Creatinine on admit 1.68 with GFR 35. Creatinine march of 2021 0.77 with GFR >60 ?P: ?Follow renal function  ?Monitor urine output ?Trend Bmet ?Avoid nephrotoxins ?Ensure adequate renal perfusion  ? ?Hyponatremia - improved  ?P: ?Trend Bmet  ? ?Best Practice (right click and "Reselect all SmartList Selections" daily)  ? ?Diet/type: Regular consistency (see orders) ?DVT prophylaxis: LMWH ?GI prophylaxis: PPI ?Lines: N/A ?Foley:  Yes, and it is still needed ?Code Status:  full code ?Last date of multidisciplinary goals of care discussion: Update patient and family daily  ? ?Critical care time: 37 mins  ?Kristinia Leavy D. Harris, NP-C ?[facility] ?Personal contact information can be found on Amion  ?10/25/2021, 7:00 AM ? ? ? ? ? ? ?

## 2021-10-26 ENCOUNTER — Inpatient Hospital Stay (HOSPITAL_COMMUNITY): Payer: BC Managed Care – PPO

## 2021-10-26 DIAGNOSIS — A419 Sepsis, unspecified organism: Principal | ICD-10-CM

## 2021-10-26 DIAGNOSIS — N179 Acute kidney failure, unspecified: Secondary | ICD-10-CM

## 2021-10-26 DIAGNOSIS — R6520 Severe sepsis without septic shock: Secondary | ICD-10-CM

## 2021-10-26 LAB — COMPREHENSIVE METABOLIC PANEL
ALT: 22 U/L (ref 0–44)
AST: 24 U/L (ref 15–41)
Albumin: 2.9 g/dL — ABNORMAL LOW (ref 3.5–5.0)
Alkaline Phosphatase: 98 U/L (ref 38–126)
Anion gap: 8 (ref 5–15)
BUN: 31 mg/dL — ABNORMAL HIGH (ref 6–20)
CO2: 24 mmol/L (ref 22–32)
Calcium: 8.6 mg/dL — ABNORMAL LOW (ref 8.9–10.3)
Chloride: 99 mmol/L (ref 98–111)
Creatinine, Ser: 1.19 mg/dL — ABNORMAL HIGH (ref 0.44–1.00)
GFR, Estimated: 53 mL/min — ABNORMAL LOW (ref >60)
Glucose, Bld: 82 mg/dL (ref 70–99)
Potassium: 3.8 mmol/L (ref 3.5–5.1)
Sodium: 131 mmol/L — ABNORMAL LOW (ref 135–145)
Total Bilirubin: 0.5 mg/dL (ref 0.3–1.2)
Total Protein: 6.9 g/dL (ref 6.5–8.1)

## 2021-10-26 LAB — CBC
HCT: 35.4 % — ABNORMAL LOW (ref 36.0–46.0)
Hemoglobin: 11.7 g/dL — ABNORMAL LOW (ref 12.0–15.0)
MCH: 30.1 pg (ref 26.0–34.0)
MCHC: 33.1 g/dL (ref 30.0–36.0)
MCV: 91 fL (ref 80.0–100.0)
Platelets: 143 10*3/uL — ABNORMAL LOW (ref 150–400)
RBC: 3.89 MIL/uL (ref 3.87–5.11)
RDW: 14.3 % (ref 11.5–15.5)
WBC: 8.7 10*3/uL (ref 4.0–10.5)
nRBC: 0 % (ref 0.0–0.2)

## 2021-10-26 LAB — LACTIC ACID, PLASMA
Lactic Acid, Venous: 0.9 mmol/L (ref 0.5–1.9)
Lactic Acid, Venous: 1.5 mmol/L (ref 0.5–1.9)

## 2021-10-26 MED ORDER — SODIUM CHLORIDE 0.9 % IV SOLN: INTRAVENOUS | Status: AC

## 2021-10-26 MED ORDER — SODIUM CHLORIDE 0.9 % IV BOLUS: 500.0000 mL | Freq: Once | INTRAVENOUS | Status: DC | PRN

## 2021-10-26 NOTE — Progress Notes (Signed)
? ?PROGRESS NOTE ? ?Karen Rhodes GEZ:[id number] DOB: April 03, 1961 DOA: 10/24/2021 ?PCP: Patient, No Pcp Per (Inactive) ? ?HPI/Recap of past 24 hours: ? Karen Rhodes is a 61yo female with PMH significant for renal stones, anxiety, and depression who presented to the ED for complaints of not being able to urinate with associated left lower flank pain. Left flank pain began 4 days prior to admission.  CT renal study was preformed and showed obstructing 67mm stones to the left ureteropelvic junction resulting in mild to moderate left-sided hydronephrosis. Urology was consulted and decided to urgently stent the left kidney. Post procedure patient remained hypotensive despite IV hydration prompting initiation of vasopressors and consult to PCCM.  Patient was admitted to the ICU for vasopressors.  Off pressors on 10/25/2021. ?TRH assumed care on 10/26/2021. ? ?10/26/2021: Patient seen and examined at bedside.  She reports her pain is 2 out of 10 in her lower abdomen.  No nausea.  Vital signs are stable. ?  ? ?Assessment/Plan: ?Principal Problem: ?  Sepsis (HCC) ?Active Problems: ?  AKI (acute kidney injury) (HCC) ? ?Severe sepsis as evidence by elevated lactic acid, leukocytosis, acute kidney injury, tachycardia, and hypotension  ?-Lactic acid on arrival 3, has normalized. ?Off vasopressors since 10/25/2021. ?Continue IV Rocephin 2 g daily. ?Continue to monitor urine output. ?Continue to monitor fever curve and WBC. ?  ?Left obstructing renal stone resulting in hydronephrosis status post left renal stent placement by Dr. Retta Diones on 10/24/2021 ?Pain control ?Management per urology ?  ?Acute Kidney Injury  - improving  ?-in the setting of obstructing renal stone. Creatinine on admit 1.68 with GFR 35. Creatinine march of 2021 0.77 with GFR >60 ?Creatinine is downtrending 1.47 with GFR 41. ?Continue to avoid nephrotoxic agents, dehydration and hypotension. ?Continue to monitor urine output with strict I's and O's. ?  ?Hyponatremia -  improved  ?Serum sodium is uptrending. ?  ?Best Practice (right click and "Reselect all SmartList Selections" daily)  ?  ?Diet/type: Regular consistency (see orders) ?DVT prophylaxis: LMWH ?GI prophylaxis: PPI ?Lines: N/A ?Foley:  Yes, and it is still needed ?Code Status:  full code ?Last date of multidisciplinary goals of care discussion: Update patient and family daily  ?  ?Critical care time: 37 mins  ? ? ? ? ? ?Status is: Inpatient ?Patient requires at least 2 midnights for further evaluation and treatment of present condition. ? ? ? ?Objective: ?Vitals:  ? 10/25/21 2027 10/26/21 6503 10/26/21 0542 10/26/21 0953  ?BP: (!) 116/49 (!) 105/57 (!) 115/58 (!) 129/56  ?Pulse: 91 83 96 83  ?Resp: 18 18 16 16   ?Temp: (!) 97.5 ?F (36.4 ?C) 98.4 ?F (36.9 ?C) 98.2 ?F (36.8 ?C) 99.3 ?F (37.4 ?C)  ?TempSrc: Oral Oral Oral   ?SpO2: 93% 93% 92% 97%  ?Weight:      ?Height:      ? ? ?Intake/Output Summary (Last 24 hours) at 10/26/2021 1202 ?Last data filed at 10/26/2021 1100 ?Gross per 24 hour  ?Intake 978.61 ml  ?Output 1750 ml  ?Net -771.39 ml  ? ?Filed Weights  ? 10/24/21 0654 10/24/21 0657  ?Weight: 72.6 kg 72.6 kg  ? ? ?Exam: ? ?General: 61 y.o. year-old female well developed well nourished in no acute distress.  Alert and oriented x3. ?Cardiovascular: Regular rate and rhythm with no rubs or gallops.  No thyromegaly or JVD noted.   ?Respiratory: Clear to auscultation with no wheezes or rales. Good inspiratory effort. ?Abdomen: Soft nontender nondistended with normal bowel sounds  x4 quadrants. ?Musculoskeletal: No lower extremity edema. 2/4 pulses in all 4 extremities. ?Skin: No ulcerative lesions noted or rashes, ?Psychiatry: Mood is appropriate for condition and setting ? ? ?Data Reviewed: ?CBC: ?Recent Labs  ?Lab 10/23/21 ?0625 10/24/21 ?0115 10/24/21 ?0930 10/25/21 ?0238  ?WBC 11.3* 14.2* 6.3 18.2*  ?NEUTROABS 6.1  --   --   --   ?HGB 14.0 12.5 10.3* 10.7*  ?HCT 43.2 37.6 32.7* 34.5*  ?MCV 92.1 91.7 94.2 95.0  ?PLT 402*  235 183 144*  ? ?Basic Metabolic Panel: ?Recent Labs  ?Lab 10/23/21 ?0625 10/24/21 ?0115 10/24/21 ?0253 10/24/21 ?0930 10/25/21 ?0238  ?NA 137 128*  --   --  132*  ?K 3.4* 3.8  --   --  5.0  ?CL 103 99  --   --  104  ?CO2 26 18*  --   --  22  ?GLUCOSE 133* 96  --   --  109*  ?BUN 14 QUANTITY NOT SUFFICIENT, UNABLE TO PERFORM TEST 24*  --  30*  ?CREATININE 0.83 1.68*  --  1.84* 1.47*  ?CALCIUM 9.1 8.6*  --   --  8.4*  ?MG  --   --   --   --  2.5*  ?PHOS  --   --   --   --  3.2  ? ?GFR: ?Estimated Creatinine Clearance: 40.8 mL/min (A) (by C-G formula based on SCr of 1.47 mg/dL (H)). ?Liver Function Tests: ?Recent Labs  ?Lab 10/23/21 ?0625  ?AST 15  ?ALT 13  ?ALKPHOS 73  ?BILITOT 0.4  ?PROT 7.7  ?ALBUMIN 3.9  ? ?Recent Labs  ?Lab 10/23/21 ?0625  ?LIPASE 32  ? ?No results for input(s): AMMONIA in the last 168 hours. ?Coagulation Profile: ?No results for input(s): INR, PROTIME in the last 168 hours. ?Cardiac Enzymes: ?No results for input(s): CKTOTAL, CKMB, CKMBINDEX, TROPONINI in the last 168 hours. ?BNP (last 3 results) ?No results for input(s): PROBNP in the last 8760 hours. ?HbA1C: ?No results for input(s): HGBA1C in the last 72 hours. ?CBG: ?No results for input(s): GLUCAP in the last 168 hours. ?Lipid Profile: ?No results for input(s): CHOL, HDL, LDLCALC, TRIG, CHOLHDL, LDLDIRECT in the last 72 hours. ?Thyroid Function Tests: ?No results for input(s): TSH, T4TOTAL, FREET4, T3FREE, THYROIDAB in the last 72 hours. ?Anemia Panel: ?No results for input(s): VITAMINB12, FOLATE, FERRITIN, TIBC, IRON, RETICCTPCT in the last 72 hours. ?Urine analysis: ?   ?Component Value Date/Time  ? COLORURINE AMBER (A) 10/24/2021 0110  ? APPEARANCEUR HAZY (A) 10/24/2021 0110  ? LABSPEC 1.019 10/24/2021 0110  ? PHURINE 5.0 10/24/2021 0110  ? GLUCOSEU NEGATIVE 10/24/2021 0110  ? HGBUR SMALL (A) 10/24/2021 0110  ? BILIRUBINUR NEGATIVE 10/24/2021 0110  ? KETONESUR NEGATIVE 10/24/2021 0110  ? PROTEINUR 30 (A) 10/24/2021 0110  ? UROBILINOGEN  0.2 06/06/2009 1200  ? NITRITE NEGATIVE 10/24/2021 0110  ? LEUKOCYTESUR SMALL (A) 10/24/2021 0110  ? ?Sepsis Labs: ?@[email or web address] (procalcitonin:4,lacticidven:4) ? ?) ?Recent Results (from the past 240 hour(s))  ?Urine Culture     Status: Abnormal  ? Collection Time: 10/24/21  1:10 AM  ? Specimen: Urine, Clean Catch  ?Result Value Ref Range Status  ? Specimen Description   Final  ?  URINE, CLEAN CATCH ?Performed at [facility], [address] [place], [place] [address] ?  ? Special Requests   Final  ?  NONE ?Performed at [facility], [address] [place], [place] [address] ?  ? Culture (A)  Final  ?  <  10,000 COLONIES/mL INSIGNIFICANT GROWTH ?Performed at [facility], [address], [place], [place] [address] ?  ? Report Status 10/25/2021 FINAL  Final  ?Blood culture (routine x 2)     Status: None (Preliminary result)  ? Collection Time: 10/24/21  4:12 AM  ? Specimen: BLOOD  ?Result Value Ref Range Status  ? Specimen Description   Final  ?  BLOOD RIGHT ANTECUBITAL ?Performed at [facility], [address], [place], [place] [address] ?  ? Special Requests   Final  ?  BOTTLES DRAWN AEROBIC AND ANAEROBIC Blood Culture adequate volume ?Performed at [facility], [address], [place], [place] [address] ?  ? Culture   Final  ?  NO GROWTH 2 DAYS ?Performed at [facility], [address], [place], [place] [address] ?  ? Report Status PENDING  Incomplete  ?Blood culture (routine x 2)     Status: None (Preliminary result)  ? Collection Time: 10/24/21  5:16 AM  ? Specimen: BLOOD  ?Result Value Ref Range Status  ? Specimen Description   Final  ?  BLOOD BLOOD LEFT FOREARM ?Performed at [facility], [address], [place], [place] [address] ?  ? Special Requests   Final  ?  BOTTLES DRAWN AEROBIC AND ANAEROBIC Blood Culture adequate volume ?Performed at [facility], [address]  [place], [place] [address] ?  ? Culture   Final  ?  NO GROWTH 2 DAYS ?Performed at [facility], [address] [place], [place] [address] ?  ? Report Status PENDING  Incomplete  ?Urine Culture     Status: Abn

## 2021-10-26 NOTE — Progress Notes (Signed)
Received a call from bedside RN regarding the patient being in red mews score due to fever and tachycardia.  Presented at bedside patient is receiving a 500 cc IV fluid NS bolus.  Lung sounds clear on exam.  Patient is somnolent after receiving pain medication, narcotic.  States her abdominal pain is about a 2 out of 10.  Fever is coming down after receiving Tylenol and so is the tachycardia.  Temperature 99.9, BP 146/60 pulse 109, respiration rate 22, O2 saturation 98% on 2 L.  We will hold off on additional IV fluid bolus and continue with IV fluid maintenance normal saline at 50 cc/h x 1 day. ? ?Updated family at bedside.   ? ?We will continue to closely monitor and treat as indicated. ?

## 2021-10-26 NOTE — Progress Notes (Signed)
?   10/26/21 1323  ?Assess: MEWS Score  ?Temp (!) 100.9 ?F (38.3 ?C)  ?BP (!) 150/72  ?Pulse Rate (!) 117  ?Resp (!) 28  ?Level of Consciousness Alert  ?SpO2 (!) 86 %  ?O2 Device Room Air  ?Assess: MEWS Score  ?MEWS Temp 1  ?MEWS Systolic 0  ?MEWS Pulse 2  ?MEWS RR 2  ?MEWS LOC 0  ?MEWS Score 5  ?MEWS Score Color Red  ?Assess: if the MEWS score is Yellow or Red  ?Were vital signs taken at a resting state? Yes  ?Focused Assessment Change from prior assessment (see assessment flowsheet)  ?Does the patient meet 2 or more of the SIRS criteria? Yes  ?Does the patient have a confirmed or suspected source of infection? No  ?MEWS guidelines implemented *See Row Information* Yes  ?Treat  ?Pain Scale 0-10  ?Pain Score 6  ?Take Vital Signs  ?Increase Vital Sign Frequency  Red: Q 1hr X 4 then Q 4hr X 4, if remains red, continue Q 4hrs  ?Escalate  ?MEWS: Escalate Red: discuss with charge nurse/RN and provider, consider discussing with RRT  ?Notify: Charge Nurse/RN  ?Name of Charge Nurse/RN Notified Gunnar Fusi  ?Date Charge Nurse/RN Notified 10/26/21  ?Time Charge Nurse/RN Notified 1323  ?Notify: Provider  ?Provider Name/Title Dr. Margo Aye  ?Date Provider Notified 10/26/21  ?Time Provider Notified 1324  ?Notification Type Page  ?Notification Reason Change in status  ?Provider response En route;Other (Comment);See new orders ?(instructed RRT via secure chat until arrival at bedside)  ?Date of Provider Response 10/26/21  ?Time of Provider Response 1350 ?(at bedside)  ?Notify: Rapid Response  ?Name of Rapid Response RN Notified Britta Mccreedy, RN  ?Date Rapid Response Notified 10/26/21  ?Time Rapid Response Notified 1325  ?Document  ?Patient Outcome Stabilized after interventions  ?Progress note created (see row info) Yes  ?Assess: SIRS CRITERIA  ?SIRS Temperature  0  ?SIRS Pulse 1  ?SIRS Respirations  1  ?SIRS WBC 0  ?SIRS Score Sum  2  ? ? ?

## 2021-10-26 NOTE — Progress Notes (Signed)
Returned at bedside to re-assess the patient.  The patient states she is feeling better.  Uses her incentive spirometer with the assistance of her daughter in law.  Her son is also present in the room. ?

## 2021-10-27 DIAGNOSIS — R6520 Severe sepsis without septic shock: Secondary | ICD-10-CM | POA: Diagnosis not present

## 2021-10-27 DIAGNOSIS — A419 Sepsis, unspecified organism: Secondary | ICD-10-CM | POA: Diagnosis not present

## 2021-10-27 DIAGNOSIS — N179 Acute kidney failure, unspecified: Secondary | ICD-10-CM | POA: Diagnosis not present

## 2021-10-27 LAB — CBC
HCT: 30.7 % — ABNORMAL LOW (ref 36.0–46.0)
Hemoglobin: 10.2 g/dL — ABNORMAL LOW (ref 12.0–15.0)
MCH: 30.2 pg (ref 26.0–34.0)
MCHC: 33.2 g/dL (ref 30.0–36.0)
MCV: 90.8 fL (ref 80.0–100.0)
Platelets: 145 10*3/uL — ABNORMAL LOW (ref 150–400)
RBC: 3.38 MIL/uL — ABNORMAL LOW (ref 3.87–5.11)
RDW: 14.4 % (ref 11.5–15.5)
WBC: 7.8 10*3/uL (ref 4.0–10.5)
nRBC: 0 % (ref 0.0–0.2)

## 2021-10-27 LAB — COMPREHENSIVE METABOLIC PANEL
ALT: 19 U/L (ref 0–44)
AST: 22 U/L (ref 15–41)
Albumin: 2.4 g/dL — ABNORMAL LOW (ref 3.5–5.0)
Alkaline Phosphatase: 119 U/L (ref 38–126)
Anion gap: 7 (ref 5–15)
BUN: 19 mg/dL (ref 6–20)
CO2: 22 mmol/L (ref 22–32)
Calcium: 7.9 mg/dL — ABNORMAL LOW (ref 8.9–10.3)
Chloride: 101 mmol/L (ref 98–111)
Creatinine, Ser: 0.97 mg/dL (ref 0.44–1.00)
GFR, Estimated: >60 mL/min (ref >60)
Glucose, Bld: 66 mg/dL — ABNORMAL LOW (ref 70–99)
Potassium: 3.5 mmol/L (ref 3.5–5.1)
Sodium: 130 mmol/L — ABNORMAL LOW (ref 135–145)
Total Bilirubin: 0.9 mg/dL (ref 0.3–1.2)
Total Protein: 6.3 g/dL — ABNORMAL LOW (ref 6.5–8.1)

## 2021-10-27 LAB — GLUCOSE, CAPILLARY
Glucose-Capillary: 101 mg/dL — ABNORMAL HIGH (ref 70–99)
Glucose-Capillary: 95 mg/dL (ref 70–99)

## 2021-10-27 LAB — LACTIC ACID, PLASMA: Lactic Acid, Venous: 0.9 mmol/L (ref 0.5–1.9)

## 2021-10-27 LAB — MAGNESIUM: Magnesium: 1.9 mg/dL (ref 1.7–2.4)

## 2021-10-27 LAB — PHOSPHORUS: Phosphorus: 2.7 mg/dL (ref 2.5–4.6)

## 2021-10-27 MED ORDER — GUAIFENESIN-DM 100-10 MG/5ML PO SYRP
5.0000 mL | ORAL_SOLUTION | ORAL | Status: DC | PRN
  Administered 2021-10-28: 5 mL via ORAL
  Filled 2021-10-27: qty 10

## 2021-10-27 MED ORDER — IPRATROPIUM-ALBUTEROL 0.5-2.5 (3) MG/3ML IN SOLN
3.0000 mL | Freq: Four times a day (QID) | RESPIRATORY_TRACT | Status: DC
  Administered 2021-10-27 (×2): 3 mL via RESPIRATORY_TRACT
  Filled 2021-10-27 (×2): qty 3

## 2021-10-27 MED ORDER — IPRATROPIUM-ALBUTEROL 0.5-2.5 (3) MG/3ML IN SOLN
3.0000 mL | Freq: Two times a day (BID) | RESPIRATORY_TRACT | Status: DC
  Administered 2021-10-28 – 2021-10-29 (×3): 3 mL via RESPIRATORY_TRACT
  Filled 2021-10-27 (×3): qty 3

## 2021-10-27 MED ORDER — ALBUTEROL SULFATE (2.5 MG/3ML) 0.083% IN NEBU: 2.5000 mg | INHALATION_SOLUTION | RESPIRATORY_TRACT | Status: DC | PRN

## 2021-10-27 NOTE — Evaluation (Signed)
Occupational Therapy Evaluation ?Patient Details ?Name: Karen Rhodes ?MRN: [id number] ?DOB: 09-24-1960 ?Today's Date: 10/27/2021 ? ? ?History of Present Illness Karen Rhodes is a 60yo female with PMH significant for renal stones, anxiety, and depression who presented to the ED 10/24/21  for complaints of not being able to urinate with associated left lower flank pain.CT renal study  showed obstructing stones to the left ureteropelvic junction resulting in mild to moderate left-sided hydronephrosis. S/P  urgent stent the left kidney on 10/24/21.  ? ?Clinical Impression ?  ?Patient evaluated by Occupational Therapy with no further acute OT needs identified. All education has been completed and the patient has no further questions. Patient is MI for ADLs at this time. Patient endorsed being able to complete Adls and not needing OT at this time. See below for any follow-up Occupational Therapy or equipment needs. OT is signing off. Thank you for this referral. ?  ?   ? ?Recommendations for follow up therapy are one component of a multi-disciplinary discharge planning process, led by the attending physician.  Recommendations may be updated based on patient status, additional functional criteria and insurance authorization.  ? ?Follow Up Recommendations ? No OT follow up  ?  ?Assistance Recommended at Discharge Intermittent Supervision/Assistance  ?Patient can return home with the following Assistance with cooking/housework;Assist for transportation ? ?  ?Functional Status Assessment ? Patient has not had a recent decline in their functional status  ?Equipment Recommendations ? None recommended by OT  ?  ?Recommendations for Other Services   ? ? ?  ?Precautions / Restrictions Precautions ?Precaution Comments: monitor VS- has had sepsis ?Restrictions ?Weight Bearing Restrictions: No  ? ?  ? ?Mobility Bed Mobility ?Overal bed mobility: Modified Independent ?  ?  ?  ?  ?  ?  ?  ?  ? ?Transfers ?  ?  ?  ?  ?  ?  ?  ?  ?  ?  ?   ? ?  ?Balance Overall balance assessment: Mild deficits observed, not formally tested ?  ?  ?  ?  ?  ?  ?  ?  ?  ?  ?  ?  ?  ?  ?  ?  ?  ?  ?   ? ?ADL either performed or assessed with clinical judgement  ? ?ADL Overall ADL's : Modified independent ?  ?  ?  ?  ?  ?  ?  ?  ?  ?  ?  ?  ?  ?  ?  ?  ?  ?  ?  ?General ADL Comments: patient was in room taking herself to the bathroom at start of session with no LOB. patient was able to complete bed mobility and LB dressing with MI. patient reported feeling pulling on catheter with strap noted to be disconnected from thigh.patient endorsed being independent at this time and not needing therapy. patient was educated on importance of keeping moble even with pain. patient verbalized understanding.  nurse made aware. nurse on way in room to check cath line at end of session.  ? ? ? ?Vision Patient Visual Report: No change from baseline ?   ?   ?Perception   ?  ?Praxis   ?  ? ?Pertinent Vitals/Pain Pain Assessment ?Pain Assessment: 0-10 ?Pain Location: 6- in head and 4- in stomach ?Pain Descriptors / Indicators: Discomfort, Grimacing ?Pain Intervention(s): Monitored during session, Limited activity within patient's tolerance, Patient requesting pain meds-RN notified, Repositioned  ? ? ? ?  Hand Dominance Right ?  ?Extremity/Trunk Assessment Upper Extremity Assessment ?Upper Extremity Assessment: Overall WFL for tasks assessed ?  ?Lower Extremity Assessment ?Lower Extremity Assessment: Defer to PT evaluation ?  ?Cervical / Trunk Assessment ?Cervical / Trunk Assessment: Normal ?  ?Communication Communication ?Communication: No difficulties ?  ?Cognition Arousal/Alertness: Awake/alert ?Behavior During Therapy: Restless, Flat affect ?Overall Cognitive Status: Within Functional Limits for tasks assessed ?  ?  ?  ?  ?  ?  ?  ?  ?  ?  ?  ?  ?  ?  ?  ?  ?General Comments: patient was fatigued with pain but cooperative and endorsed not needing OT on this date. ?  ?  ?General Comments     ? ?  ?Exercises   ?  ?Shoulder Instructions    ? ? ?Home Living Family/patient expects to be discharged to:: Private residence ?Living Arrangements: Spouse/significant other ?Available Help at Discharge: Family;Available PRN/intermittently ?Type of Home: House ?Home Access: Stairs to enter ?Entrance Stairs-Number of Steps: 6 ?Entrance Stairs-Rails: Right ?Home Layout: One level ?  ?  ?Bathroom Shower/Tub: Tub/shower unit ?  ?  ?  ?  ?Home Equipment: None ?  ?  ?  ? ?  ?Prior Functioning/Environment Prior Level of Function : Independent/Modified Independent ?  ?  ?  ?  ?  ?  ?  ?  ?  ? ?  ?  ?OT Problem List:   ?  ?   ?OT Treatment/Interventions:    ?  ?OT Goals(Current goals can be found in the care plan section) Acute Rehab OT Goals ?Patient Stated Goal: to get pain and abdominal discomfort under control ?OT Goal Formulation: All assessment and education complete, DC therapy  ?OT Frequency:   ?  ? ?Co-evaluation   ?  ?  ?  ?  ? ?  ?AM-PAC OT "6 Clicks" Daily Activity     ?Outcome Measure Help from another person eating meals?: None ?Help from another person taking care of personal grooming?: None ?Help from another person toileting, which includes using toliet, bedpan, or urinal?: None ?Help from another person bathing (including washing, rinsing, drying)?: None ?Help from another person to put on and taking off regular upper body clothing?: None ?Help from another person to put on and taking off regular lower body clothing?: None ?6 Click Score: 24 ?  ?End of Session Nurse Communication: Other (comment);Patient requests pain meds (catheter line concerns and) ? ?Activity Tolerance: Patient tolerated treatment well ?Patient left: in bed;with call bell/phone within reach ? ?OT Visit Diagnosis: Unsteadiness on feet (R26.81)  ?              ?Time: [id number] ?OT Time Calculation (min): 10 min ?Charges:  OT General Charges ?$OT Visit: 1 Visit ?OT Evaluation ?$OT Eval Low Complexity: 1 Low ? ?Vivia Rosenburg OTR/L, MS ?Acute  Rehabilitation Department ?Office# [phone] ?Pager# [phone] ? ? ?[place] ?10/27/2021, 2:58 PM ?

## 2021-10-27 NOTE — Progress Notes (Addendum)
? ?PROGRESS NOTE ? ?Karen Rhodes OQH:[id number] DOB: [birth year] DOA: 10/24/2021 ?PCP: Patient, No Pcp Per (Inactive) ? ?HPI/Recap of past 24 hours: ? Karen Rhodes is a 61yo female with PMH significant for renal stones, anxiety, and depression who presented to [facility] ED for complaints of not being able to urinate with associated left lower flank pain. Left flank pain began 4 days prior to admission.  CT renal study was preformed and showed obstructing 2mm stones to the left ureteropelvic junction resulting in mild to moderate left-sided hydronephrosis. Urology was consulted and decided to urgently stent the left kidney. Post procedure patient remained hypotensive despite IV fluid hydration prompting initiation of vasopressors and consult to PCCM.  Patient was admitted to the ICU for vasopressors.  Off pressors on 10/25/2021. ?TRH assumed care on 10/26/2021. ? ?Hospital course complicated by generalized weakness.  Seen by PT OT. ? ?10/27/2021: Seen and examined at bedside.  States she feels poorly like she has the flu.  Would not elaborate. ?  ? ?Assessment/Plan: ?Principal Problem: ?  Sepsis (HCC) ?Active Problems: ?  AKI (acute kidney injury) (HCC) ? ?Severe sepsis, resolving, secondary to possible intra-abdominal infection. ?UA positive for pyuria, urine culture unrevealing ?-Lactic acid on arrival 3, has normalized. ?Blood cultures negative to date x2 ?Leukocytosis has resolved ?Lactic acid has normalized for the past 48 hours. ?Off vasopressors since 10/25/2021. ?Continue IV Rocephin 2 g daily. ?Continue to monitor urine output. ?Continue to monitor fever curve and WBC. ?  ?Left obstructing renal stone resulting in hydronephrosis status post left renal stent placement by Dr. Retta Diones on 10/24/2021 ?Continue pain control and bowel regimen ?Management per urology ? ?Hypoglycemia due to poor oral intake. ?Serum glucose this morning 66 ?Encourage increase in oral protein calorie intake. ?Not on antiglycemic's. ?Start CBG 3  times daily. ? ?Acute hypoxic respiratory failure possibly secondary to atelectasis ?Not on oxygen supplementation at baseline ?Oxygen saturation 86% ambient air improved to 94% on 2 L. ?Personally reviewed chest x-ray done on 10/26/2021, no evidence of pulmonary edema or infiltrate suggestive of pneumonia.  Bibasilar atelectasis noted. ?Incentive spirometer ordered. ?Bronchodilators and pulmonary toilet. ?Mobilize as tolerated ?Wean off oxygen supplementation as tolerated. ?  ?Resolved acute Kidney Injury   ?-in the setting of obstructing renal stone. Creatinine on admit 1.68 with GFR 35. Creatinine march of 2021 0.77 with GFR >60 ?Creatinine appears to be returning back to baseline, 0.97 with GFR greater than 60. ?Continue to avoid nephrotoxic agents, dehydration and hypotension. ?Continue to monitor urine output with strict I's and O's. ?  ?Hyponatremia ?Serum sodium 130. ? ?Chronic anxiety/depression ?Continue home duloxetine ? ?Tobacco use disorder ?Continue nicotine patch ? ?Chronic urinary retention ?Foley cath in place. ?Monitor urine output ? ?Generalized weakness ?Seen by PT OT ?Continue to mobilize as tolerated with assistance and fall precautions. ? ? ?  ?Best Practice (right click and "Reselect all SmartList Selections" daily)  ?  ?Diet/type: Regular consistency (see orders) ?DVT prophylaxis: LMWH ?GI prophylaxis: PPI ?Lines: N/A ?Foley:  Yes, and it is still needed ?Code Status:  full code ?Last date of multidisciplinary goals of care discussion: Update patient and family daily  ?  ?Critical care time: 40 minutes.  ? ? ? ? ? ?Status is: Inpatient ?Patient requires at least 2 midnights for further evaluation and treatment of present condition. ? ? ? ?Objective: ?Vitals:  ? 10/27/21 0055 10/27/21 0604 10/27/21 0833 10/27/21 1236  ?BP: (!) 108/54 138/72 134/63 (!) 113/58  ?Pulse: 84 97 98 81  ?  Resp: 16 20 16 16   ?Temp: 98.1 ?F (36.7 ?C) 99.4 ?F (37.4 ?C) 98.7 ?F (37.1 ?C) 98.7 ?F (37.1 ?C)  ?TempSrc:    Oral Oral  ?SpO2: 99% 97% 98% 94%  ?Weight:      ?Height:      ? ? ?Intake/Output Summary (Last 24 hours) at 10/27/2021 1241 ?Last data filed at 10/27/2021 0900 ?Gross per 24 hour  ?Intake 14.27 ml  ?Output 2350 ml  ?Net -2335.73 ml  ? ?Filed Weights  ? 10/24/21 0654 10/24/21 0657  ?Weight: 72.6 kg 72.6 kg  ? ? ?Exam: ? ?General: 61 y.o. year-old female well-developed well-nourished in no acute distress.  She is alert and oriented x3.   ?Cardiovascular: Regular rate and rhythm no rubs or gallops. ?Respiratory: Clear to auscultation with no wheezes or rales.   ?Abdomen: Soft nontender normal bowel sounds present. ?Musculoskeletal: Trace lower extremity edema bilaterally  ?skin: No ulcerative lesions noted. ?Psychiatry: Mood is appropriate for condition and setting. ? ? ?Data Reviewed: ?CBC: ?Recent Labs  ?Lab 10/23/21 ?0625 10/24/21 ?0115 10/24/21 ?0930 10/25/21 ?0238 10/26/21 ?1140 10/27/21 ?0737  ?WBC 11.3* 14.2* 6.3 18.2* 8.7 7.8  ?NEUTROABS 6.1  --   --   --   --   --   ?HGB 14.0 12.5 10.3* 10.7* 11.7* 10.2*  ?HCT 43.2 37.6 32.7* 34.5* 35.4* 30.7*  ?MCV 92.1 91.7 94.2 95.0 91.0 90.8  ?PLT 402* 235 183 144* 143* 145*  ? ?Basic Metabolic Panel: ?Recent Labs  ?Lab 10/23/21 ?0625 10/24/21 ?0115 10/24/21 ?0253 10/24/21 ?0930 10/25/21 ?[id number] 10/26/21 ?1140 10/27/21 ?0737  ?NA 137 128*  --   --  132* 131* 130*  ?K 3.4* 3.8  --   --  5.0 3.8 3.5  ?CL 103 99  --   --  104 99 101  ?CO2 26 18*  --   --  22 24 22   ?GLUCOSE 133* 96  --   --  109* 82 66*  ?BUN 14 QUANTITY NOT SUFFICIENT, UNABLE TO PERFORM TEST 24*  --  30* 31* 19  ?CREATININE 0.83 1.68*  --  1.84* 1.47* 1.19* 0.97  ?CALCIUM 9.1 8.6*  --   --  8.4* 8.6* 7.9*  ?MG  --   --   --   --  2.5*  --  1.9  ?PHOS  --   --   --   --  3.2  --  2.7  ? ?GFR: ?Estimated Creatinine Clearance: 61.8 mL/min (by C-G formula based on SCr of 0.97 mg/dL). ?Liver Function Tests: ?Recent Labs  ?Lab 10/23/21 ?0625 10/26/21 ?1140 10/27/21 ?0737  ?AST 15 24 22   ?ALT 13 22 19   ?ALKPHOS 73 98  119  ?BILITOT 0.4 0.5 0.9  ?PROT 7.7 6.9 6.3*  ?ALBUMIN 3.9 2.9* 2.4*  ? ?Recent Labs  ?Lab 10/23/21 ?0625  ?LIPASE 32  ? ?No results for input(s): AMMONIA in the last 168 hours. ?Coagulation Profile: ?No results for input(s): INR, PROTIME in the last 168 hours. ?Cardiac Enzymes: ?No results for input(s): CKTOTAL, CKMB, CKMBINDEX, TROPONINI in the last 168 hours. ?BNP (last 3 results) ?No results for input(s): PROBNP in the last 8760 hours. ?HbA1C: ?No results for input(s): HGBA1C in the last 72 hours. ?CBG: ?No results for input(s): GLUCAP in the last 168 hours. ?Lipid Profile: ?No results for input(s): CHOL, HDL, LDLCALC, TRIG, CHOLHDL, LDLDIRECT in the last 72 hours. ?Thyroid Function Tests: ?No results for input(s): TSH, T4TOTAL, FREET4, T3FREE, THYROIDAB in the last 72 hours. ?Anemia Panel: ?No  results for input(s): VITAMINB12, FOLATE, FERRITIN, TIBC, IRON, RETICCTPCT in the last 72 hours. ?Urine analysis: ?   ?Component Value Date/Time  ? COLORURINE AMBER (A) 10/24/2021 0110  ? APPEARANCEUR HAZY (A) 10/24/2021 0110  ? LABSPEC 1.019 10/24/2021 0110  ? PHURINE 5.0 10/24/2021 0110  ? GLUCOSEU NEGATIVE 10/24/2021 0110  ? HGBUR SMALL (A) 10/24/2021 0110  ? BILIRUBINUR NEGATIVE 10/24/2021 0110  ? KETONESUR NEGATIVE 10/24/2021 0110  ? PROTEINUR 30 (A) 10/24/2021 0110  ? UROBILINOGEN 0.2 06/06/2009 1200  ? NITRITE NEGATIVE 10/24/2021 0110  ? LEUKOCYTESUR SMALL (A) 10/24/2021 0110  ? ?Sepsis Labs: ?@LABRCNTIP (procalcitonin:4,lacticidven:4) ? ?) ?Recent Results (from the past 240 hour(s))  ?Urine Culture     Status: Abnormal  ? Collection Time: 10/24/21  1:10 AM  ? Specimen: Urine, Clean Catch  ?Result Value Ref Range Status  ? Specimen Description   Final  ?  URINE, CLEAN CATCH ?Performed at [facility], [address] [place], [place] ?  ? Special Requests   Final  ?  NONE ?Performed at [facility], [address] [place], [place] ?  ? Culture (A)  Final  ?   <10,000 COLONIES/mL INSIGNIFICANT GROWTH ?Performed at [facility], [address], [place], [place] ?  ? Report Status 10/25/2021 FINAL  Final  ?Blood culture (routine x 2)     Status

## 2021-10-27 NOTE — Evaluation (Signed)
Physical Therapy Evaluation ?Patient Details ?Name: Karen Rhodes ?MRN: [id number] ?DOB: [birth year] ?Today's Date: 10/27/2021 ? ?History of Present Illness ? Karen Rhodes is a 61yo female with PMH significant for renal stones, anxiety, and depression who presented to the ED 10/24/21  for complaints of not being able to urinate with associated left lower flank pain.CT renal study  showed obstructing stones to the left ureteropelvic junction resulting in mild to moderate left-sided hydronephrosis. S/P  urgent stent the left kidney on 10/24/21.  ?Clinical Impression ? Patient indicating that she is concerned about feeling like she did yesterday. MD and RN in room. Patient assisted to recliner with min assistance. Patient  should progress to return home with family as medical issues improve. Pt admitted with above diagnosis.  Pt currently with functional limitations due to the deficits listed below (see PT Problem List). Pt will benefit from skilled PT to increase their independence and safety with mobility to allow discharge to the venue listed below.   ? BP  129/64, Spo2 97% on 2 L, 94 HR   ?   ? ?Recommendations for follow up therapy are one component of a multi-disciplinary discharge planning process, led by the attending physician.  Recommendations may be updated based on patient status, additional functional criteria and insurance authorization. ? ?Follow Up Recommendations No PT follow up ? ?  ?Assistance Recommended at Discharge Intermittent Supervision/Assistance  ?Patient can return home with the following ? A little help with walking and/or transfers;A little help with bathing/dressing/bathroom;Help with stairs or ramp for entrance ? ?  ?Equipment Recommendations None recommended by PT  ?Recommendations for Other Services ?    ?  ?Functional Status Assessment Patient has had a recent decline in their functional status and demonstrates the ability to make significant improvements in function in a reasonable and  predictable amount of time.  ? ?  ?Precautions / Restrictions Precautions ?Precaution Comments: monitor VS- has had sepsis  ? ?  ? ?Mobility ? Bed Mobility ?Overal bed mobility: Modified Independent ?  ?  ?  ?  ?  ?  ?  ?  ? ?Transfers ?Overall transfer level: Needs assistance ?Equipment used: 1 person hand held assist ?Transfers: Sit to/from Stand, Bed to chair/wheelchair/BSC ?Sit to Stand: Min assist ?  ?Step pivot transfers: Min assist ?  ?  ?  ?General transfer comment: pt given HHA to stand and step to recliner, patient reporting feeling dizziness ?  ? ?Ambulation/Gait ?  ?  ?  ?  ?  ?  ?  ?  ? ?Stairs ?  ?  ?  ?  ?  ? ?Wheelchair Mobility ?  ? ?Modified Rankin (Stroke Patients Only) ?  ? ?  ? ?Balance Overall balance assessment: Mild deficits observed, not formally tested ?  ?  ?  ?  ?  ?  ?  ?  ?  ?  ?  ?  ?  ?  ?  ?  ?  ?  ?   ? ? ? ?Pertinent Vitals/Pain Pain Assessment ?Pain Assessment: 0-10 ?Pain Score: 0-No pain  ? ? ?Home Living Family/patient expects to be discharged to:: Private residence ?Living Arrangements: Spouse/significant other ?Available Help at Discharge: Family;Available PRN/intermittently ?Type of Home: House ?Home Access: Stairs to enter ?Entrance Stairs-Rails: Right ?Entrance Stairs-Number of Steps: 6 ?  ?Home Layout: One level ?Home Equipment: None ?   ?  ?Prior Function Prior Level of Function : Independent/Modified Independent ?  ?  ?  ?  ?  ?  ?  ?  ?  ? ? ?  Hand Dominance  ? Dominant Hand: Right ? ?  ?Extremity/Trunk Assessment  ? Upper Extremity Assessment ?Upper Extremity Assessment: Overall WFL for tasks assessed ?  ? ?Lower Extremity Assessment ?Lower Extremity Assessment: Overall WFL for tasks assessed ?  ? ?Cervical / Trunk Assessment ?Cervical / Trunk Assessment: Normal  ?Communication  ? Communication: No difficulties  ?Cognition Arousal/Alertness: Awake/alert ?Behavior During Therapy: Restless, Flat affect ?Overall Cognitive Status: Within Functional Limits for tasks  assessed ?  ?  ?  ?  ?  ?  ?  ?  ?  ?  ?  ?  ?  ?  ?  ?  ?General Comments: patient stating" I f don't want what happened yesterday to happen" ?  ?  ? ?  ?General Comments   ? ?  ?Exercises    ? ?Assessment/Plan  ?  ?PT Assessment Patient needs continued PT services  ?PT Problem List   ? ?   ?  ?PT Treatment Interventions Therapeutic activities;Gait training;Therapeutic exercise;Functional mobility training;Patient/family education   ? ?PT Goals (Current goals can be found in the Care Plan section)  ?Acute Rehab PT Goals ?Patient Stated Goal: feel better, go home ?PT Goal Formulation: With patient ?Time For Goal Achievement: 10/05/21 ?Potential to Achieve Goals: Good ? ?  ?Frequency Min 3X/week ?  ? ? ?Co-evaluation   ?  ?  ?  ?  ? ? ?  ?AM-PAC PT "6 Clicks" Mobility  ?Outcome Measure Help needed turning from your back to your side while in a flat bed without using bedrails?: None ?Help needed moving from lying on your back to sitting on the side of a flat bed without using bedrails?: None ?Help needed moving to and from a bed to a chair (including a wheelchair)?: A Little ?Help needed standing up from a chair using your arms (e.g., wheelchair or bedside chair)?: A Little ?Help needed to walk in hospital room?: A Lot ?Help needed climbing 3-5 steps with a railing? : A Lot ?6 Click Score: 18 ? ?  ?End of Session   ?Activity Tolerance: Patient limited by fatigue ?Patient left: in chair;with call bell/phone within reach;with nursing/sitter in room ?Nurse Communication: Mobility status ?PT Visit Diagnosis: Unsteadiness on feet (R26.81) ?  ? ?Time: 5621-3086 ?PT Time Calculation (min) (ACUTE ONLY): 16 min ? ? ?Charges:   PT Evaluation ?$PT Eval Low Complexity: 1 Low ?  ?  ?   ? ? ?Blanchard Kelch PT ?Acute Rehabilitation Services ?Pager [phone] ?Office [phone] ? ? ?Elnore Cosens, Jobe Igo ?10/27/2021, 10:34 AM ? ?

## 2021-10-28 DIAGNOSIS — R6520 Severe sepsis without septic shock: Secondary | ICD-10-CM | POA: Diagnosis not present

## 2021-10-28 DIAGNOSIS — N179 Acute kidney failure, unspecified: Secondary | ICD-10-CM | POA: Diagnosis not present

## 2021-10-28 DIAGNOSIS — A419 Sepsis, unspecified organism: Secondary | ICD-10-CM | POA: Diagnosis not present

## 2021-10-28 LAB — GLUCOSE, CAPILLARY
Glucose-Capillary: 61 mg/dL — ABNORMAL LOW (ref 70–99)
Glucose-Capillary: 77 mg/dL (ref 70–99)
Glucose-Capillary: 95 mg/dL (ref 70–99)
Glucose-Capillary: 87 mg/dL (ref 70–99)

## 2021-10-28 MED ORDER — SORBITOL 70 % SOLN
300.0000 mL | TOPICAL_OIL | Freq: Once | ORAL | Status: AC
  Administered 2021-10-28: 300 mL via RECTAL
  Filled 2021-10-28: qty 90

## 2021-10-28 NOTE — Progress Notes (Addendum)
Received a call from bedside RN Ms. Sondra Barges regarding the patient not looking good.  Requested a set of vital signs.  Temperature 98.9, BP 131/64, pulse 86, respiratory 16, O2 saturation 94% on room air.  Presented to bedside to assess the patient.  She is sitting up in a chair, getting ready to eat her dinner.  No pain, no dyspnea, no work of breathing.  Patient states her appetite is not very good.  Advised to make an effort to eat so she can regain her energy.  Also advised to follow-up with her primary care provider and see if her chronic anxiety/depression medications need adjustment.  Patient is worrying that missed doses of her home duloxetine may have caused her to feel this way. ? ?The patient's Foley catheter was removed this afternoon and she passed her voiding trial.  She had 2 bowel movements after her enema. ? ? ?Patient is hemodynamically stable and can be discharged to home.  Anticipate discharge date on 10/29/2021.  Urology will see today. ? ? ? ?Charge note. ?

## 2021-10-28 NOTE — Discharge Instructions (Signed)

## 2021-10-28 NOTE — Progress Notes (Signed)
4 Days Post-Op ?Subjective: ?Patient reports  ? ?Objective: ?Vital signs in last 24 hours: ?Temp:  [97.8 ?F (36.6 ?C)-98.9 ?F (37.2 ?C)] 98.9 ?F (37.2 ?C) (05/01 1652) ?Pulse Rate:  [89-98] 96 (05/01 1652) ?Resp:  [16-20] 16 (05/01 1652) ?BP: (107-131)/(58-67) 131/64 (05/01 1652) ?SpO2:  [93 %-99 %] 94 % (05/01 1652) ? ?Intake/Output from previous day: ?04/30 0701 - 05/01 0700 ?In: 360 [P.O.:360] ?Out: 2700 [Urine:2700] ?Intake/Output this shift: ?Total I/O ?In: 340 [P.O.:340] ?Out: 950 [Urine:950] ? ?Physical Exam:  ?Constitutional: Vital signs reviewed. WD WN in NAD   ?Eyes: PERRL, No scleral icterus.   ?Cardiovascular: RRR ?Pulmonary/Chest: Normal effort ?Extremities: No cyanosis or edema  ? ?Lab Results: ?Recent Labs  ?  10/26/21 ?1140 10/27/21 ?0737  ?HGB 11.7* 10.2*  ?HCT 35.4* 30.7*  ? ?BMET ?Recent Labs  ?  10/26/21 ?1140 10/27/21 ?0737  ?NA 131* 130*  ?K 3.8 3.5  ?CL 99 101  ?CO2 24 22  ?GLUCOSE 82 66*  ?BUN 31* 19  ?CREATININE 1.19* 0.97  ?CALCIUM 8.6* 7.9*  ? ?No results for input(s): LABPT, INR in the last 72 hours. ?No results for input(s): LABURIN in the last 72 hours. ?Results for orders placed or performed during the hospital encounter of 10/24/21  ?Urine Culture     Status: Abnormal  ? Collection Time: 10/24/21  1:10 AM  ? Specimen: Urine, Clean Catch  ?Result Value Ref Range Status  ? Specimen Description   Final  ?  URINE, CLEAN CATCH ?Performed at [facility], [place] [address], [place], [place] [address] ?  ? Special Requests   Final  ?  NONE ?Performed at [facility], [place] [address], [place], [place] [address] ?  ? Culture (A)  Final  ?  <10,000 COLONIES/mL INSIGNIFICANT GROWTH ?Performed at [facility], [place] [address] [place], [place] [address] ?  ? Report Status 10/25/2021 FINAL  Final  ?Blood culture (routine x 2)     Status: None (Preliminary result)  ? Collection Time: 10/24/21  4:12 AM  ? Specimen: BLOOD  ?Result Value Ref Range Status  ?  Specimen Description   Final  ?  BLOOD RIGHT ANTECUBITAL ?Performed at [facility], [place] [address], [place], [place] [address] ?  ? Special Requests   Final  ?  BOTTLES DRAWN AEROBIC AND ANAEROBIC Blood Culture adequate volume ?Performed at [facility], [place] [address] [place], [place] [address] ?  ? Culture   Final  ?  NO GROWTH 4 DAYS ?Performed at [facility], [place] [address], [place], [place] [address] ?  ? Report Status PENDING  Incomplete  ?Blood culture (routine x 2)     Status: None (Preliminary result)  ? Collection Time: 10/24/21  5:16 AM  ? Specimen: BLOOD  ?Result Value Ref Range Status  ? Specimen Description   Final  ?  BLOOD BLOOD LEFT FOREARM ?Performed at [facility], [place] [address], [place], [place] [address] ?  ? Special Requests   Final  ?  BOTTLES DRAWN AEROBIC AND ANAEROBIC Blood Culture adequate volume ?Performed at [facility], [place] [address] [place],  [address] ?  ? Culture   Final  ?  NO GROWTH 4 DAYS ?Performed at [facility], [place] [address], [place],  [address] ?  ? Report Status PENDING  Incomplete  ?Urine Culture     Status: Abnormal  ? Collection Time: 10/24/21  7:40 AM  ? Specimen: Urine, Cystoscope  ?Result Value Ref Range Status  ? Specimen Description   Final  ?  URINE, CLEAN CATCH ?Performed at [facility], [place] [address] [place], [place] [address] ?  ? Special Requests   Final  ?  NONE CYSTOSCOPY WITH URETEROSCPY AND STENT PLACEMENT ?Performed at [facility], [place] [address], [place], [place] [address] ?  ? Culture MULTIPLE SPECIES PRESENT, SUGGEST RECOLLECTION (A)  Final  ? Report Status 10/25/2021 FINAL  Final  ?MRSA Next Gen by PCR, Nasal     Status: None  ? Collection Time: 10/24/21  4:39 PM  ? Specimen: Nasal Mucosa; Nasal Swab  ?Result Value Ref Range Status  ? MRSA by PCR Next Gen NOT DETECTED NOT DETECTED Final  ?  Comment:  (NOTE) ?The GeneXpert MRSA Assay (FDA approved for NASAL specimens only), ?is one component of a comprehensive MRSA colonization surveillance ?program. It is not intended to diagnose MRSA infection nor to guide ?or monitor treatment for MRSA infections. ?Test performance is not FDA approved in patients less than 2 years ?old. ?Performed at [facility], [place] [address] ?[place], [place] [address] ?  ? ? ?Studies/Results: ?No results found. ? ?Assessment/Plan: ? ?Status post emergent stenting of infected/obstructing left ureteral calculi on 4/27.  She is doing better. ? ?Her Foley catheter is out and she has voided. ? ?We will call to set up follow-up appointment to set her up for eventual ureteroscopic management of her stones.  I would recommend 2 full weeks of antibiotics starting from initiation of therapy on the 27th. ? ? LOS: 4 days  ? ?Karen Rhodes ?10/28/2021, 6:35 PM ? ? ? ?

## 2021-10-28 NOTE — Progress Notes (Signed)
? ?PROGRESS NOTE ? ?Karen Rhodes [id number]:[id number] DOB: [birth year] DOA: 10/24/2021 ?PCP: Patient, No Pcp Per (Inactive) ? ?HPI/Recap of past 24 hours: ? Karen Rhodes is a 61yo female with PMH significant for renal stones, anxiety, and depression who presented to [facility] ED for complaints of not being able to urinate with associated left lower flank pain.  CT renal study was preformed and showed obstructing 3 mm stones to the left ureteropelvic junction resulting in mild to moderate left-sided hydronephrosis.  Seen by urology urgently, status post stent of the left kidney.  Became hypotensive and did not respond to IV fluid hydration.  She was started on vasopressors and admitted to the ICU.  Off pressors on 10/25/2021. ?TRH assumed care on 10/26/2021. ? ?Hospital course complicated by generalized weakness.  Seen by PT OT, no further PT OT recommendations. ? ?10/28/2021: Seen and examined at her bedside.  Reports being constipated since admission.  Enema ordered.  Her Foley catheter will be removed for voiding trial.  Urology, Dr. Retta Diones will see her today.  Anticipate discharge date 10/29/2021. ?  ? ?Assessment/Plan: ?Principal Problem: ?  Sepsis (HCC) ?Active Problems: ?  AKI (acute kidney injury) (HCC) ? ?Severe sepsis, resolved, secondary to possible intra-abdominal infection. ?UA positive for pyuria, urine culture unrevealing ?-Lactic acid on arrival 3, has normalized. ?Blood cultures negative to date x2 ?Leukocytosis has resolved afebrile and nontoxic-appearing. ?Off vasopressors since 10/25/2021. ?Completed 5 days of Rocephin 2 g daily. ?  ?Left obstructing renal stone resulting in hydronephrosis status post left renal stent placement by Dr. Retta Diones on 10/24/2021 ?Continue pain control and bowel regimen ?Management per urology ?Remove Foley catheter for voiding trial here as recommended by urology. ? ?Hypoglycemia due to poor oral intake. ?Serum glucose this morning 61 ?Encourage increase in oral protein calorie  intake. ?Not on hypoglycemics ?Continue CBG 3 times daily. ? ?Chronic constipation ?1 dose of smog enema ? ?Resolved acute hypoxic respiratory failure possibly secondary to atelectasis ?Not on oxygen supplementation at baseline ?Oxygen saturation 86% ambient air improved to 94% on 2 L. ?Personally reviewed chest x-ray done on 10/26/2021, no evidence of pulmonary edema or infiltrate suggestive of pneumonia.  Bibasilar atelectasis noted. ?Incentive spirometer, continue ?Bronchodilators and pulmonary toilet, continue as needed. ?Mobilize as tolerated, continue ?Wean off oxygen supplementation as tolerated and maintain O2 saturation greater than 90%.. ?  ?Resolved acute Kidney Injury   ?-in the setting of obstructing renal stone. Creatinine on admit 1.68 with GFR 35. Creatinine march of 2021 0.77 with GFR >60 ?Creatinine back to baseline, 0.97 with GFR greater than 60. ?Continue to avoid nephrotoxic agents, dehydration and hypotension. ?Continue to monitor urine output with strict I's and O's. ?  ?Hyponatremia ?Serum sodium 130. ?Encourage oral intake. ? ?Chronic anxiety/depression ?Continue home duloxetine ? ?Tobacco use disorder ?Continue nicotine patch ? ?Chronic urinary retention ?Remove Foley catheter on 10/28/2021. ?Continue to monitor urine output ? ?Generalized weakness ?Continue to mobilize as tolerated with assistance and fall precautions. ? ?Disposition: Plan to discharge to home on 10/29/2021 ? ? ?Status is: Inpatient ?Patient requires at least 2 midnights for further evaluation and treatment of present condition. ? ? ? ?Objective: ?Vitals:  ? 10/27/21 2142 10/28/21 0150 10/28/21 0550 10/28/21 0737  ?BP: 107/60 129/67 (!) 120/58   ?Pulse: 90 98 89   ?Resp: 20 18 16    ?Temp: 98.4 ?F (36.9 ?C) 98.2 ?F (36.8 ?C) 98 ?F (36.7 ?C)   ?TempSrc: Oral Oral Oral   ?SpO2: 97% 97% 98% 96%  ?Weight:      ?  Height:      ? ? ?Intake/Output Summary (Last 24 hours) at 10/28/2021 1202 ?Last data filed at 10/28/2021 0720 ?Gross per 24  hour  ?Intake 240 ml  ?Output 2950 ml  ?Net -2710 ml  ? ?Filed Weights  ? 10/24/21 0654 10/24/21 0657  ?Weight: 72.6 kg 72.6 kg  ? ? ?Exam: ? ?General: 61 y.o. year-old female well-developed well-nourished in no acute distress.  She is alert and noted x3. ?Cardiovascular: Regular rate and rhythm no rubs or gallops.   ?Respiratory: Clear to auscultation no wheeze or rales. ?Abdomen: Soft noted normal bowel sounds present.   ?Musculoskeletal: Trace lower extremity edema bilaterally.   ?Skin: No ulcerative lesions noted. ?Psychiatry: Mood is appropriate for condition stable. ? ?Data Reviewed: ?CBC: ?Recent Labs  ?Lab 10/23/21 ?0625 10/24/21 ?0115 10/24/21 ?0930 10/25/21 ?0238 10/26/21 ?1140 10/27/21 ?0737  ?WBC 11.3* 14.2* 6.3 18.2* 8.7 7.8  ?NEUTROABS 6.1  --   --   --   --   --   ?HGB 14.0 12.5 10.3* 10.7* 11.7* 10.2*  ?HCT 43.2 37.6 32.7* 34.5* 35.4* 30.7*  ?MCV 92.1 91.7 94.2 95.0 91.0 90.8  ?PLT 402* 235 183 144* 143* 145*  ? ?Basic Metabolic Panel: ?Recent Labs  ?Lab 10/23/21 ?0625 10/24/21 ?0115 10/24/21 ?0253 10/24/21 ?0930 10/25/21 ?[id number] 10/26/21 ?1140 10/27/21 ?0737  ?NA 137 128*  --   --  132* 131* 130*  ?K 3.4* 3.8  --   --  5.0 3.8 3.5  ?CL 103 99  --   --  104 99 101  ?CO2 26 18*  --   --  22 24 22   ?GLUCOSE 133* 96  --   --  109* 82 66*  ?BUN 14 QUANTITY NOT SUFFICIENT, UNABLE TO PERFORM TEST 24*  --  30* 31* 19  ?CREATININE 0.83 1.68*  --  1.84* 1.47* 1.19* 0.97  ?CALCIUM 9.1 8.6*  --   --  8.4* 8.6* 7.9*  ?MG  --   --   --   --  2.5*  --  1.9  ?PHOS  --   --   --   --  3.2  --  2.7  ? ?GFR: ?Estimated Creatinine Clearance: 61.8 mL/min (by C-G formula based on SCr of 0.97 mg/dL). ?Liver Function Tests: ?Recent Labs  ?Lab 10/23/21 ?0625 10/26/21 ?1140 10/27/21 ?0737  ?AST 15 24 22   ?ALT 13 22 19   ?ALKPHOS 73 98 119  ?BILITOT 0.4 0.5 0.9  ?PROT 7.7 6.9 6.3*  ?ALBUMIN 3.9 2.9* 2.4*  ? ?Recent Labs  ?Lab 10/23/21 ?0625  ?LIPASE 32  ? ?No results for input(s): AMMONIA in the last 168 hours. ?Coagulation  Profile: ?No results for input(s): INR, PROTIME in the last 168 hours. ?Cardiac Enzymes: ?No results for input(s): CKTOTAL, CKMB, CKMBINDEX, TROPONINI in the last 168 hours. ?BNP (last 3 results) ?No results for input(s): PROBNP in the last 8760 hours. ?HbA1C: ?No results for input(s): HGBA1C in the last 72 hours. ?CBG: ?Recent Labs  ?Lab 10/27/21 ?1442 10/27/21 ?1740 10/28/21 ?[id number] 10/28/21 ?[id number] 10/28/21 ?1141  ?GLUCAP 101* 95 61* 77 95  ? ?Lipid Profile: ?No results for input(s): CHOL, HDL, LDLCALC, TRIG, CHOLHDL, LDLDIRECT in the last 72 hours. ?Thyroid Function Tests: ?No results for input(s): TSH, T4TOTAL, FREET4, T3FREE, THYROIDAB in the last 72 hours. ?Anemia Panel: ?No results for input(s): VITAMINB12, FOLATE, FERRITIN, TIBC, IRON, RETICCTPCT in the last 72 hours. ?Urine analysis: ?   ?Component Value Date/Time  ? COLORURINE AMBER (A) 10/24/2021 0110  ?  APPEARANCEUR HAZY (A) 10/24/2021 0110  ? LABSPEC 1.019 10/24/2021 0110  ? PHURINE 5.0 10/24/2021 0110  ? GLUCOSEU NEGATIVE 10/24/2021 0110  ? HGBUR SMALL (A) 10/24/2021 0110  ? BILIRUBINUR NEGATIVE 10/24/2021 0110  ? KETONESUR NEGATIVE 10/24/2021 0110  ? PROTEINUR 30 (A) 10/24/2021 0110  ? UROBILINOGEN 0.2 06/06/2009 1200  ? NITRITE NEGATIVE 10/24/2021 0110  ? LEUKOCYTESUR SMALL (A) 10/24/2021 0110  ? ?Sepsis Labs: ?@LABRCNTIP (procalcitonin:4,lacticidven:4) ? ?) ?Recent Results (from the past 240 hour(s))  ?Urine Culture     Status: Abnormal  ? Collection Time: 10/24/21  1:10 AM  ? Specimen: Urine, Clean Catch  ?Result Value Ref Range Status  ? Specimen Description   Final  ?  URINE, CLEAN CATCH ?Performed at [facility], [address] [place], [place] ?  ? Special Requests   Final  ?  NONE ?Performed at [facility], [address] [place], [place] ?  ? Culture (A)  Final  ?  <10,000 COLONIES/mL INSIGNIFICANT GROWTH ?Performed at [facility], [address] [place], [place] ?  ?  Report Status 10/25/2021 FINAL  Final  ?Blood culture (routine x 2)     Status: None (Preliminary result)  ? Collection Time: 10/24/21  4:12 AM  ? Specimen: BLOOD  ?Result Value Ref Range Status  ? Specimen Descr

## 2021-10-29 DIAGNOSIS — R109 Unspecified abdominal pain: Secondary | ICD-10-CM

## 2021-10-29 DIAGNOSIS — R10A2 Flank pain, left side: Secondary | ICD-10-CM

## 2021-10-29 DIAGNOSIS — N201 Calculus of ureter: Secondary | ICD-10-CM

## 2021-10-29 DIAGNOSIS — E860 Dehydration: Secondary | ICD-10-CM | POA: Diagnosis not present

## 2021-10-29 DIAGNOSIS — N390 Urinary tract infection, site not specified: Secondary | ICD-10-CM | POA: Diagnosis not present

## 2021-10-29 DIAGNOSIS — A419 Sepsis, unspecified organism: Secondary | ICD-10-CM | POA: Diagnosis not present

## 2021-10-29 LAB — GLUCOSE, CAPILLARY: Glucose-Capillary: 76 mg/dL (ref 70–99)

## 2021-10-29 MED ORDER — POLYETHYLENE GLYCOL 3350 17 G PO PACK: 17.0000 g | PACK | Freq: Every day | ORAL | 0 refills | Status: DC | PRN

## 2021-10-29 MED ORDER — CEPHALEXIN 500 MG PO CAPS
500.0000 mg | ORAL_CAPSULE | Freq: Two times a day (BID) | ORAL | 0 refills | Status: AC
Start: 2021-10-29 — End: 2021-11-06

## 2021-10-29 MED ORDER — CEPHALEXIN 500 MG PO CAPS
500.0000 mg | ORAL_CAPSULE | Freq: Two times a day (BID) | ORAL | Status: DC
  Administered 2021-10-29: 500 mg via ORAL
  Filled 2021-10-29: qty 1

## 2021-10-29 MED ORDER — NICOTINE 21 MG/24HR TD PT24: 21.0000 mg | MEDICATED_PATCH | TRANSDERMAL | 0 refills | Status: DC

## 2021-10-29 MED ORDER — DOCUSATE SODIUM 100 MG PO CAPS: 100.0000 mg | ORAL_CAPSULE | Freq: Two times a day (BID) | ORAL | 0 refills | Status: DC | PRN

## 2021-10-29 NOTE — Discharge Summary (Signed)
?Physician Discharge Summary ?  ?Patient: Karen Rhodes MRN: [id number] DOB: [birth year]  ?Admit date:     10/24/2021  ?Discharge date: 10/29/21  ?Discharge Physician: Lorella Nimrod  ? ?PCP: Patient, No Pcp Per (Inactive)  ? ?Recommendations at discharge:  ?CBC and BMP in 1 week ?Follow-up with urology for definitive treatment of stone ? ?Discharge Diagnoses: ?Principal Problem: ?  Sepsis ([place]) ?Active Problems: ?  AKI (acute kidney injury) ([place]) ?  Acute UTI ?  Dehydration ?  Left flank pain ?  Left ureteral stone ? ? ?Hospital Course: ?Karen Rhodes is a 61yo female with PMH significant for renal stones, anxiety, and depression who presented to [facility] ED for complaints of not being able to urinate with associated left lower flank pain.  CT renal study was preformed and showed obstructing 3 mm stones to the left ureteropelvic junction resulting in mild to moderate left-sided hydronephrosis.  Seen by urology urgently, status post stent of the left kidney.  Became hypotensive and did not respond to IV fluid hydration.  She was started on vasopressors and admitted to the ICU.  Off pressors on 10/25/2021. ?TRH assumed care on 10/26/2021. ?  ?Hospital course complicated by generalized weakness.  Seen by PT OT, no further PT OT recommendations. ? ?Foley catheter was removed on 10/28/2021.  Able to void without any difficulty. ?Urology is recommending outpatient follow-up now for definitive care of her nephrolithiasis.  They also recommended 2 weeks of antibiotics, patient received ceftriaxone while in the hospital and discharged on Keflex. ? ?Patient also had AKI on admission which has been resolved with IV fluid. ? ?Patient will continue with rest of her home medications and follow-up with her providers as an outpatient. ? ? ?Pain control - [employer] Controlled Substance Reporting System database was reviewed. and patient was instructed, not to drive, operate heavy machinery, perform activities at heights, swimming or  participation in water activities or provide baby-sitting services while on Pain, Sleep and Anxiety Medications; until their outpatient Physician has advised to do so again. Also recommended to not to take more than prescribed Pain, Sleep and Anxiety Medications.  ?Consultants: Neurology ?Procedures performed: Left ureteral stent placement ?Disposition: Home ?Diet recommendation:  ?Discharge Diet Orders (From admission, onward)  ? ?  Start     Ordered  ? 10/29/21 0000  Diet - low sodium heart healthy       ? 10/29/21 1031  ? ?  ?  ? ?  ? ?Regular diet ?DISCHARGE MEDICATION: ?Allergies as of 10/29/2021   ? ?   Reactions  ? Asa [aspirin] Nausea Only  ? Penicillins Rash  ? ?  ? ?  ?Medication List  ?  ? ?STOP taking these medications   ? ?celecoxib 200 MG capsule ?Commonly known as: CeleBREX ?  ?LORazepam 1 MG tablet ?Commonly known as: ATIVAN ?  ? ?  ? ?TAKE these medications   ? ?amphetamine-dextroamphetamine 30 MG tablet ?Commonly known as: ADDERALL ?Take 30 mg by mouth 2 (two) times daily. ?  ?cephALEXin 500 MG capsule ?Commonly known as: KEFLEX ?Take 1 capsule (500 mg total) by mouth every 12 (twelve) hours for 8 days. ?  ?D3-50 1.25 MG (50000 UT) capsule ?Generic drug: Cholecalciferol ?Take 50,000 Units by mouth once a week. Tuesday/Wednesday ?  ?docusate sodium 100 MG capsule ?Commonly known as: COLACE ?Take 1 capsule (100 mg total) by mouth 2 (two) times daily as needed for mild constipation. ?  ?DULoxetine 30 MG capsule ?Commonly known as: CYMBALTA ?  Take 30 mg by mouth 3 (three) times daily. ?  ?nicotine 21 mg/24hr patch ?Commonly known as: NICODERM CQ - dosed in mg/24 hours ?Place 1 patch (21 mg total) onto the skin daily. ?  ?ondansetron 4 MG tablet ?Commonly known as: Zofran ?Take 1 tablet (4 mg total) by mouth every 8 (eight) hours as needed for nausea or vomiting. ?  ?oxyCODONE-acetaminophen 5-325 MG tablet ?Commonly known as: Percocet ?Take 1-2 tablets by mouth every 6 (six) hours as needed. ?What changed:  reasons to take this ?  ?polyethylene glycol 17 g packet ?Commonly known as: MIRALAX / GLYCOLAX ?Take 17 g by mouth daily as needed for moderate constipation. ?  ?tamsulosin 0.4 MG Caps capsule ?Commonly known as: FLOMAX ?Take 1 capsule (0.4 mg total) by mouth 2 (two) times daily. ?  ?traZODone 50 MG tablet ?Commonly known as: DESYREL ?Take 50 mg by mouth at bedtime as needed for sleep. ?  ?zolpidem 10 MG tablet ?Commonly known as: AMBIEN ?Take 5 mg by mouth at bedtime as needed for sleep. ?  ? ?  ? ? Follow-up Information   ? ? [facility] Follow up.   ?Why: We will call you to set up an appointment to discuss eventual management of your left sided stones ?Contact information: ?[address] ?[phone] ? ?  ?  ? ?  ?  ? ?  ? ?Discharge Exam: ?Filed Weights  ? 10/24/21 0654 10/24/21 0657 10/29/21 0500  ?Weight: 72.6 kg 72.6 kg 85.4 kg  ? ?General.     In no acute distress. ?Pulmonary.  Lungs clear bilaterally, normal respiratory effort. ?CV.  Regular rate and rhythm, no JVD, rub or murmur. ?Abdomen.  Soft, nontender, nondistended, BS positive. ?CNS.  Alert and oriented x3.  No focal neurologic deficit. ?Extremities.  No edema, no cyanosis, pulses intact and symmetrical. ?Psychiatry.  Judgment and insight appears normal.  ? ?Condition at discharge: stable ? ?The results of significant diagnostics from this hospitalization (including imaging, microbiology, ancillary and laboratory) are listed below for reference.  ? ?Imaging Studies: ?DG CHEST PORT 1 VIEW ? ?Result Date: 10/26/2021 ?CLINICAL DATA:  Shortness of breath EXAM: PORTABLE CHEST 1 VIEW COMPARISON:  2020 FINDINGS: Shallow inspiration with low lung volumes. Linear atelectasis/scarring bilaterally. No pleural effusion or pneumothorax. Heart size for technique. No acute osseous abnormality. Cholecystectomy clips. IMPRESSION: No acute process in the chest. Electronically Signed   By: Macy Mis M.D.    On: 10/26/2021 14:31  ? ?DG C-Arm 1-60 Min-No Report ? ?Result Date: 10/24/2021 ?Fluoroscopy was utilized by the requesting physician.  No radiographic interpretation.  ? ?CT RENAL STONE STUDY ? ?Result Date: 10/23/2021 ?CLINICAL DATA:  Flank pain since this a.m., leukocytosis, EXAM: CT ABDOMEN AND PELVIS WITHOUT CONTRAST TECHNIQUE: Multidetector CT imaging of the abdomen and pelvis was performed following the standard protocol without IV contrast. RADIATION DOSE REDUCTION: This exam was performed according to the departmental dose-optimization program which includes automated exposure control, adjustment of the mA and/or kV according to patient size and/or use of iterative reconstruction technique. COMPARISON:  CT June 06, 2009 FINDINGS: Lower chest: No acute abnormality. Hepatobiliary: Unremarkable noncontrast appearance of the hepatic parenchyma. Gallbladder surgically absent. No biliary ductal dilation. Pancreas: No pancreatic ductal dilation or evidence of acute inflammation. Spleen: No splenomegaly or focal splenic lesion. Adrenals/Urinary Tract: Bilateral adrenal glands appear normal. Edematous appearance of the left kidney with perinephric stranding and hydronephrosis to the level of a few stacked  stones at the ureteropelvic junction measuring up to 3 mm. No right-sided hydronephrosis. No additional nephrolithiasis identified. Urinary bladder is unremarkable for degree of distension. Stomach/Bowel: Stomach is distended with ingested material without focal wall thickening. No pathologic dilation of small or large bowel. Normal appendix and terminal ileum. There is some mural fatty infiltration along the proximal ascending colon, commonly reflecting sequela of chronic inflammation. No evidence of acute bowel inflammation. A few scattered left-sided colonic diverticula without findings of acute diverticulitis. Vascular/Lymphatic: Aortic and branch vessel atherosclerosis without abdominal aortic aneurysm. No  pathologically enlarged abdominal or pelvic lymph nodes. Reproductive: Status post hysterectomy. No adnexal masses. Other: No significant abdominopelvic free fluid. Musculoskeletal: L5-S1 discogenic disease.

## 2021-10-31 LAB — CULTURE, BLOOD (ROUTINE X 2)
Culture: NO GROWTH
Culture: NO GROWTH
Special Requests: ADEQUATE
Special Requests: ADEQUATE

## 2021-11-08 ENCOUNTER — Other Ambulatory Visit: Payer: Self-pay | Admitting: Urology

## 2021-11-12 ENCOUNTER — Encounter (HOSPITAL_COMMUNITY): Payer: Self-pay

## 2021-11-12 ENCOUNTER — Encounter (HOSPITAL_COMMUNITY): Payer: Self-pay | Admitting: Urology

## 2021-11-12 NOTE — Progress Notes (Addendum)
For Short Stay: ?COVID SWAB appointment date: N/A ?Date of COVID positive in last 47 days:N/A ? ?Bowel Prep reminder: N/A ? ? ?For Anesthesia: ?PCP - N/A ?Cardiologist - N/A ? ?Chest x-ray - 10/26/21 in epic ?EKG - 10/24/21 in epic ?Stress Test - greater than 2 years ?ECHO - N/A ?Cardiac Cath - N/A ?Pacemaker/ICD device last checked: N/A ?Pacemaker orders received:N/A ?Device Rep notified:N/A ? ?Spinal Cord Stimulator: N/A ? ?Sleep Study - N/A ?CPAP -  ? ?Fasting Blood Sugar - N/A ?Checks Blood Sugar ___N/A__ times a day ?Date and result of last Hgb A1c-N/A ? ?Blood Thinner Instructions: N/A ?Aspirin Instructions: N/A ?Last Dose: N/A ? ?Activity level: Can go up a flight of stairs and activities of daily living without stopping and without chest pain and/or shortness of breath ?     ?Anesthesia review: N/A ? ?Patient denies shortness of breath, fever, cough and chest pain at PAT appointment ? ? ?Patient verbalized understanding of instructions that were given to them at the PAT appointment. Patient was also instructed that they will need to review over the PAT instructions again at home before surgery.  ?

## 2021-11-12 NOTE — Progress Notes (Signed)
Attempted to obtain medical history via telephone, unable to reach at this time.  Voicemail full unable to leave a message.  Sent a e-mail via epic to request patient to call me back. ?

## 2021-11-13 ENCOUNTER — Other Ambulatory Visit: Payer: Self-pay

## 2021-11-13 ENCOUNTER — Encounter (HOSPITAL_COMMUNITY): Payer: Self-pay | Admitting: Urology

## 2021-11-13 NOTE — H&P (Signed)
H&P ? ?Chief Complaint: Left-sided kidney stone ? ?History of Present Illness: Karen Rhodes is a 61 y.o. year old female presenting for ureteroscopic management of a left-sided proximal ureteral stone.  She initially presented to the emergency room on 4.27.2023 with left flank pain, and urinate, and in the emergency room was found to have a urinary tract infection associated with a proximal left ureteral stone.  She was hypotensive and had evidence of sepsis.  She underwent urgent stenting at that time, followed by resuscitation, management in the ICU, antibiotic management with eve on made ntual discharge several days later.  She was seen in our office 12th.  At that time urine culture was negative.  She was educated on ureteroscopic management of her obstructing stone with eventual replacement of stent following laser lithotripsy of the stone.  She understands and presents at this time for management. ? ?Past Medical History:  ?Diagnosis Date  ? Allergy   ? Anxiety   ? Depression   ? History of kidney stones   ? Ulcer   ? ? ?Past Surgical History:  ?Procedure Laterality Date  ? ABDOMINAL HYSTERECTOMY    ? CHOLECYSTECTOMY    ? CYSTOSCOPY WITH URETEROSCOPY AND STENT PLACEMENT Left 10/24/2021  ? Procedure: CYSTOSCOPY WITh retrograde AND STENT PLACEMENT;  Surgeon: Marcine Matar, MD;  Location: [facility];  Service: Urology;  Laterality: Left;  ? TUBAL LIGATION    ? ? ?Home Medications:  ?No medications prior to admission.  ? ? ?Allergies:  ?Allergies  ?Allergen Reactions  ? Asa [Aspirin] Nausea Only  ? Nsaids Nausea Only  ? Other   ?  Blood Refusal  ? Penicillins Rash  ?  Childhood reaction.  ? ? ?Family History  ?Problem Relation Age of Onset  ? Heart disease Mother   ? Cancer Mother   ? Depression Sister   ? Heart disease Maternal Grandmother   ? Dementia Paternal Grandmother   ? ? ?Social History:  reports that she has been smoking cigarettes. She has never used smokeless tobacco. She reports current alcohol use.  She reports that she does not use drugs. ? ?ROS: ?A complete review of systems was performed.  All systems are negative except for pertinent findings as noted. ? ?Physical Exam:  ?Vital signs in last 24 hours: ?Weight:  [72.1 kg] 72.1 kg (05/17 1329) ?General:  Alert and oriented, No acute distress ?HEENT: Normocephalic, atraumatic ?Neck: No JVD or lymphadenopathy ?Cardiovascular: Regular rate  ?Lungs: Normal inspiratory/expiratory excursion ?Extremities: No edema ?Neurologic: Grossly intact ? ?I have reviewed prior pt notes ? ?I have reviewed urinalysis results ? ?I have independently reviewed prior imaging ? ?I have reviewed prior urine culture  ? ?Impression/Assessment:  ?Left proximal ureteral stone status post recent presentation with sepsis/pyelonephrosis with stenting.  She presents at this time for management after antibiotic treatment. ? ?Plan:  ?Cystoscopy, left double-J stent extraction, retrograde study of her left ureter/pyelocalyceal system, left ureteroscopy, laser lithotripsy and possible extraction of stone, double-J stent replacement ? ?Bertram Millard Osiris Charles ?11/13/2021, 4:58 PM  ?Bertram Millard. Karen Foulk MD ? ? ?

## 2021-11-13 NOTE — Discharge Instructions (Addendum)
You may see some blood in the urine and may have some burning with urination for 48-72 hours. You also may notice that you have to urinate more frequently or urgently after your procedure which is normal.  You should call should you develop an inability urinate, fever > 101, persistent nausea and vomiting that prevents you from eating or drinking to stay hydrated.  If you have a stent, you will likely urinate more frequently and urgently until the stent is removed and you may experience some discomfort/pain in the lower abdomen and flank especially when urinating. You may take pain medication prescribed to you if needed for pain. You may also intermittently have blood in the urine until the stent is removed.  It is okay to pull the thread at the end of the stent and remove it on Monday morning.

## 2021-11-13 NOTE — Anesthesia Preprocedure Evaluation (Addendum)
Anesthesia Evaluation  Patient identified by MRN, date of birth, ID band Patient awake    Reviewed: Allergy & Precautions, NPO status , Patient's Chart, lab work & pertinent test results  Airway Mallampati: II  TM Distance: >3 FB Neck ROM: Full    Dental no notable dental hx. (+) Teeth Intact, Dental Advisory Given   Pulmonary Current Smoker and Patient abstained from smoking., former smoker,    Pulmonary exam normal breath sounds clear to auscultation       Cardiovascular Exercise Tolerance: Good Normal cardiovascular exam Rhythm:Regular Rate:Normal     Neuro/Psych    GI/Hepatic negative GI ROS, Neg liver ROS,   Endo/Other    Renal/GU Renal disease     Musculoskeletal negative musculoskeletal ROS (+)   Abdominal   Peds  Hematology   Anesthesia Other Findings   Reproductive/Obstetrics                            Anesthesia Physical Anesthesia Plan  ASA: 2  Anesthesia Plan: General   Post-op Pain Management: Minimal or no pain anticipated   Induction: Intravenous  PONV Risk Score and Plan: 3 and Treatment may vary due to age or medical condition, Midazolam, Ondansetron and Dexamethasone  Airway Management Planned: LMA  Additional Equipment: None  Intra-op Plan:   Post-operative Plan:   Informed Consent: I have reviewed the patients History and Physical, chart, labs and discussed the procedure including the risks, benefits and alternatives for the proposed anesthesia with the patient or authorized representative who has indicated his/her understanding and acceptance.     Dental advisory given  Plan Discussed with: CRNA and Anesthesiologist  Anesthesia Plan Comments:        Anesthesia Quick Evaluation

## 2021-11-14 ENCOUNTER — Ambulatory Visit (HOSPITAL_COMMUNITY): Payer: BC Managed Care – PPO

## 2021-11-14 ENCOUNTER — Ambulatory Visit (HOSPITAL_COMMUNITY): Payer: BC Managed Care – PPO | Admitting: Anesthesiology

## 2021-11-14 ENCOUNTER — Encounter (HOSPITAL_COMMUNITY)
Admission: RE | Disposition: A | Discharge status: Home / Self Care | Payer: Self-pay | Source: Home / Self Care | Attending: Urology

## 2021-11-14 ENCOUNTER — Ambulatory Visit (HOSPITAL_COMMUNITY)
Admission: RE | Admit: 2021-11-14 | Discharge: 2021-11-14 | Disposition: A | Discharge status: Home / Self Care | Payer: BC Managed Care – PPO | Attending: Urology | Admitting: Urology

## 2021-11-14 ENCOUNTER — Encounter (HOSPITAL_COMMUNITY): Payer: Self-pay | Admitting: Urology

## 2021-11-14 DIAGNOSIS — N289 Disorder of kidney and ureter, unspecified: Secondary | ICD-10-CM

## 2021-11-14 DIAGNOSIS — N200 Calculus of kidney: Secondary | ICD-10-CM | POA: Diagnosis not present

## 2021-11-14 DIAGNOSIS — N201 Calculus of ureter: Secondary | ICD-10-CM | POA: Diagnosis present

## 2021-11-14 HISTORY — PX: CYSTOSCOPY/URETEROSCOPY/HOLMIUM LASER/STENT PLACEMENT: SHX6546

## 2021-11-14 HISTORY — DX: Personal history of urinary calculi: Z87.442

## 2021-11-14 LAB — BASIC METABOLIC PANEL
Anion gap: 9 (ref 5–15)
BUN: 10 mg/dL (ref 6–20)
CO2: 26 mmol/L (ref 22–32)
Calcium: 9.2 mg/dL (ref 8.9–10.3)
Chloride: 102 mmol/L (ref 98–111)
Creatinine, Ser: 1.01 mg/dL — ABNORMAL HIGH (ref 0.44–1.00)
GFR, Estimated: >60 mL/min (ref >60)
Glucose, Bld: 98 mg/dL (ref 70–99)
Potassium: 3.1 mmol/L — ABNORMAL LOW (ref 3.5–5.1)
Sodium: 137 mmol/L (ref 135–145)

## 2021-11-14 LAB — NO BLOOD PRODUCTS

## 2021-11-14 SURGERY — CYSTOSCOPY/URETEROSCOPY/HOLMIUM LASER/STENT PLACEMENT
Anesthesia: General | Laterality: Left

## 2021-11-14 MED ORDER — ONDANSETRON HCL 4 MG/2ML IJ SOLN
INTRAMUSCULAR | Status: DC | PRN
  Administered 2021-11-14: 4 mg via INTRAVENOUS

## 2021-11-14 MED ORDER — DEXAMETHASONE SODIUM PHOSPHATE 10 MG/ML IJ SOLN
INTRAMUSCULAR | Status: DC | PRN
Start: 2021-11-14 — End: 2021-11-14
  Administered 2021-11-14: 10 mg via INTRAVENOUS

## 2021-11-14 MED ORDER — 0.9 % SODIUM CHLORIDE (POUR BTL) OPTIME
TOPICAL | Status: DC | PRN
  Administered 2021-11-14: 1000 mL

## 2021-11-14 MED ORDER — LACTATED RINGERS IV SOLN: INTRAVENOUS | Status: DC

## 2021-11-14 MED ORDER — CHLORHEXIDINE GLUCONATE 0.12 % MT SOLN
15.0000 mL | Freq: Once | OROMUCOSAL | Status: AC
  Administered 2021-11-14: 15 mL via OROMUCOSAL

## 2021-11-14 MED ORDER — PROPOFOL 10 MG/ML IV BOLUS
INTRAVENOUS | Status: DC | PRN
  Administered 2021-11-14: 180 mg via INTRAVENOUS

## 2021-11-14 MED ORDER — MIDAZOLAM HCL 2 MG/2ML IJ SOLN
INTRAMUSCULAR | Status: AC
  Filled 2021-11-14: qty 2

## 2021-11-14 MED ORDER — ORAL CARE MOUTH RINSE: 15.0000 mL | Freq: Once | OROMUCOSAL | Status: AC

## 2021-11-14 MED ORDER — LIDOCAINE HCL (PF) 2 % IJ SOLN
INTRAMUSCULAR | Status: AC
  Filled 2021-11-14: qty 5

## 2021-11-14 MED ORDER — FENTANYL CITRATE (PF) 100 MCG/2ML IJ SOLN
INTRAMUSCULAR | Status: DC | PRN
  Administered 2021-11-14: 25 ug via INTRAVENOUS
  Administered 2021-11-14: 50 ug via INTRAVENOUS
  Administered 2021-11-14: 25 ug via INTRAVENOUS

## 2021-11-14 MED ORDER — FENTANYL CITRATE (PF) 100 MCG/2ML IJ SOLN
INTRAMUSCULAR | Status: AC
  Filled 2021-11-14: qty 2

## 2021-11-14 MED ORDER — SULFAMETHOXAZOLE-TRIMETHOPRIM 800-160 MG PO TABS: 1.0000 | ORAL_TABLET | Freq: Two times a day (BID) | ORAL | 0 refills | Status: DC

## 2021-11-14 MED ORDER — CIPROFLOXACIN IN D5W 400 MG/200ML IV SOLN
400.0000 mg | INTRAVENOUS | Status: AC
  Administered 2021-11-14: 400 mg via INTRAVENOUS
  Filled 2021-11-14: qty 200

## 2021-11-14 MED ORDER — PROPOFOL 10 MG/ML IV BOLUS
INTRAVENOUS | Status: AC
Start: 2021-11-14 — End: ?
  Filled 2021-11-14: qty 20

## 2021-11-14 MED ORDER — DEXAMETHASONE SODIUM PHOSPHATE 10 MG/ML IJ SOLN
INTRAMUSCULAR | Status: AC
  Filled 2021-11-14: qty 1

## 2021-11-14 MED ORDER — MIDAZOLAM HCL 5 MG/5ML IJ SOLN
INTRAMUSCULAR | Status: DC | PRN
  Administered 2021-11-14: 2 mg via INTRAVENOUS

## 2021-11-14 MED ORDER — ONDANSETRON HCL 4 MG/2ML IJ SOLN
INTRAMUSCULAR | Status: AC
  Filled 2021-11-14: qty 2

## 2021-11-14 MED ORDER — LIDOCAINE 2% (20 MG/ML) 5 ML SYRINGE
INTRAMUSCULAR | Status: DC | PRN
  Administered 2021-11-14: 80 mg via INTRAVENOUS

## 2021-11-14 MED ORDER — SODIUM CHLORIDE 0.9 % IR SOLN
Status: DC | PRN
  Administered 2021-11-14: 3000 mL

## 2021-11-14 SURGICAL SUPPLY — 24 items
BAG URO CATCHER STRL LF (MISCELLANEOUS) ×2 IMPLANT
BASKET ZERO TIP NITINOL 2.4FR (BASKET) ×1 IMPLANT
CATH URETL OPEN END 6FR 70 (CATHETERS) ×1 IMPLANT
EXTRACTOR STONE 1.7FRX115CM (UROLOGICAL SUPPLIES) ×2 IMPLANT
GLOVE SURG LX 8.0 MICRO (GLOVE) ×1
GLOVE SURG LX STRL 8.0 MICRO (GLOVE) ×1 IMPLANT
GOWN STRL REUS W/ TWL XL LVL3 (GOWN DISPOSABLE) ×1 IMPLANT
GOWN STRL REUS W/TWL XL LVL3 (GOWN DISPOSABLE) ×2
GUIDEWIRE ANG ZIPWIRE 038X150 (WIRE) IMPLANT
GUIDEWIRE STR DUAL SENSOR (WIRE) ×2 IMPLANT
IV NS 1000ML (IV SOLUTION)
IV NS 1000ML BAXH (IV SOLUTION) ×1 IMPLANT
KIT TURNOVER KIT A (KITS) IMPLANT
LASER FIB FLEXIVA PULSE ID 365 (Laser) IMPLANT
MANIFOLD NEPTUNE II (INSTRUMENTS) ×2 IMPLANT
PACK CYSTO (CUSTOM PROCEDURE TRAY) ×2 IMPLANT
SHEATH NAVIGATOR HD 11/13X28 (SHEATH) ×1 IMPLANT
SHEATH NAVIGATOR HD 11/13X36 (SHEATH) IMPLANT
SHEATH NAVIGATOR HD 12/14X46 (SHEATH) IMPLANT
STENT URET 6FRX24 CONTOUR (STENTS) ×1 IMPLANT
TRACTIP FLEXIVA PULS ID 200XHI (Laser) IMPLANT
TRACTIP FLEXIVA PULSE ID 200 (Laser)
TUBING CONNECTING 10 (TUBING) ×2 IMPLANT
TUBING UROLOGY SET (TUBING) ×2 IMPLANT

## 2021-11-14 NOTE — Op Note (Signed)
Preoperative diagnosis: Left upper ureteral stone with history of sepsis/stenting  Postoperative diagnosis: Left renal stone,, history of stenting  Principal procedure: Cystoscopy, left double-J stent extraction, left ureteroscopy (semirigid and flexible), left ureteroscopic stone extraction, placement of 6 French by 24 cm contour double-J stent with tether, use of fluoroscopy for less than 1 hour  Surgeon: Ilean Spradlin  Anesthesia: General with LMA  Complications: None  Specimen: Stone  Estimated blood loss: None  Indications: 61 year old female presenting for definitive management of a left-sided stone.  Initial presentation approximately 3 weeks ago with sepsis and pyelonephrosis from an upper ureteral stone.  She underwent urgent stenting, hospitalization, antibiotic management, and now returns for stone extraction.  I explained the procedure to her, expected outcomes, risks and complications including but not limited to infection, ureteral injury, need for further stenting, anesthetic complications, among others.  She understands and desires to proceed.  Findings: Urothelium of bladder appeared normal.  Ureteral orifices were normal.  Stent at the left ureteral orifice.  One 3 x 7 mm stone in the upper pole calyces.  Several matrix/sludgy particles that were not calcified.  No further left renal calculi.  Description of procedure: Patient was properly identified and marked in the holding area.  She is taken to the operating room where general anesthetic was administered.  IV Cipro administered.  She was placed in dorsolithotomy position, genitalia and perineum were prepped, draped, proper timeout performed.  21 French panendoscope placed into the bladder.  Circumferential inspection performed.  Left stent was extracted, brought out through the urethral meatus and cannulated with a sensor tip guidewire which was advanced in the upper pole calyceal system using fluoroscopic guidance.  The stent  was then removed over top of the guidewire.  I then passed a 6 French semirigid dual-lumen short ureteroscope into the left renal pelvis.  The ureter was normal up to that point.  The definitive stone was seen plus several small brownish particles that looked like matrix.  The stone was grasped and easily extracted using an engage basket.  I then passed the scope up again.  The small other particles were grasped and extracted.  These were not calcified.  I then remove the semirigid scope.  I passed an 11/13 short ureteral access catheter over top of the guidewire.  I then passed the flexible single-lumen digital ureteroscope into the renal pelvis.  Systematic inspection of the pelvis and all calyces was performed.  No further calcifications were seen.  The scope was then removed.  I replaced the guidewire and remove the access catheter.  Guidewire was backloaded through the cystoscope and then a 24 cm x 6 French Contour double-J stent was placed over top of the guidewire, moved up to the renal pelvis using fluoroscopic guidance.  The stent was then deployed with the guidewire removed with excellent proximal and distal curl seen.  The bladder was drained.  The tether was left intact.  It was brought out through the urethral meatus, tied at the meatus, trimmed, and tucked in the vagina.  At this point, the procedure was terminated.  The patient was awakened and taken to the PACU in stable condition.  She tolerated the procedure well.

## 2021-11-14 NOTE — Anesthesia Procedure Notes (Signed)
Procedure Name: LMA Insertion Date/Time: 11/14/2021 7:45 AM Performed by: Nishan Ovens D, CRNA Pre-anesthesia Checklist: Patient identified, Emergency Drugs available, Suction available and Patient being monitored Patient Re-evaluated:Patient Re-evaluated prior to induction Oxygen Delivery Method: Circle system utilized Preoxygenation: Pre-oxygenation with 100% oxygen Induction Type: IV induction Ventilation: Mask ventilation without difficulty LMA: LMA with gastric port inserted LMA Size: 4.0 Tube type: Oral Number of attempts: 1 Placement Confirmation: positive ETCO2 and breath sounds checked- equal and bilateral Tube secured with: Tape Dental Injury: Teeth and Oropharynx as per pre-operative assessment

## 2021-11-14 NOTE — Transfer of Care (Addendum)
Immediate Anesthesia Transfer of Care Note  Patient: Karen Rhodes  Procedure(s) Performed: CYSTOSCOPY/URETEROSCOPY/STONE EXTRACTION/STENT EXCHANGE (Left)  Patient Location: PACU  Anesthesia Type:General  Level of Consciousness: awake, alert  and oriented  Airway & Oxygen Therapy: Patient Spontanous Breathing and Patient connected to face mask oxygen  Post-op Assessment: Report given to RN and Post -op Vital signs reviewed and stable  Post vital signs: Reviewed and stable  Last Vitals:  Vitals Value Taken Time  BP    Temp    Pulse    Resp    SpO2      Last Pain:  Vitals:   11/14/21 0547  TempSrc: Oral  PainSc: 0-No pain      Patients Stated Pain Goal: 3 (11/13/21 1329)  Complications: No notable events documented.

## 2021-11-14 NOTE — Interval H&P Note (Signed)
History and Physical Interval Note:  11/14/2021 7:29 AM  Karen Rhodes  has presented today for surgery, with the diagnosis of LEFT URETERAL CALCULI.  The various methods of treatment have been discussed with the patient and family. After consideration of risks, benefits and other options for treatment, the patient has consented to  Procedure(s) with comments: CYSTOSCOPY/URETEROSCOPY/HOLMIUM LASER/STENT EXCHANGE (Left) - 60 MINUTES as a surgical intervention.  The patient's history has been reviewed, patient examined, no change in status, stable for surgery.  I have reviewed the patient's chart and labs.  Questions were answered to the patient's satisfaction.     Bertram Millard Makaiya Geerdes

## 2021-11-14 NOTE — OR Nursing (Signed)
Stone taken by Dr. Dahlstedt. 

## 2021-11-14 NOTE — Anesthesia Postprocedure Evaluation (Signed)
Anesthesia Post Note  Patient: Karen Rhodes  Procedure(s) Performed: CYSTOSCOPY/URETEROSCOPY/STONE EXTRACTION/STENT EXCHANGE (Left)     Patient location during evaluation: PACU Anesthesia Type: General Level of consciousness: awake and alert Pain management: pain level controlled Vital Signs Assessment: post-procedure vital signs reviewed and stable Respiratory status: spontaneous breathing, nonlabored ventilation, respiratory function stable and patient connected to nasal cannula oxygen Cardiovascular status: blood pressure returned to baseline and stable Postop Assessment: no apparent nausea or vomiting Anesthetic complications: no   No notable events documented.  Last Vitals:  Vitals:   11/14/21 0830 11/14/21 0845  BP: 111/63 (!) 118/57  Pulse: 77 78  Resp: 16 12  Temp:  36.5 C  SpO2: 100% 98%    Last Pain:  Vitals:   11/14/21 0845  TempSrc:   PainSc: 0-No pain                 Barnet Glasgow

## 2021-11-15 ENCOUNTER — Encounter (HOSPITAL_COMMUNITY): Payer: Self-pay | Admitting: Urology

## 2021-11-15 DIAGNOSIS — N289 Disorder of kidney and ureter, unspecified: Secondary | ICD-10-CM

## 2022-08-09 IMAGING — CT CT RENAL STONE PROTOCOL
2 of 4 series · 15 of 46 positions shown, 17 images · non-contrast
Comparison: CT June 06, 2009

CLINICAL DATA: Flank pain since this a.m., leukocytosis,



[Series 2: axial st · axial · 0.72mm/px · z∈[+1160,+1560]mm · 12 of 90 slices shown, 14 images]
[im 5/90  soft-tissue]
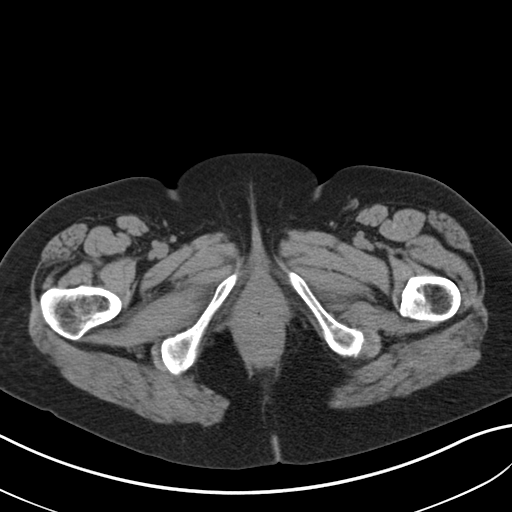
[im 5/90  bone]
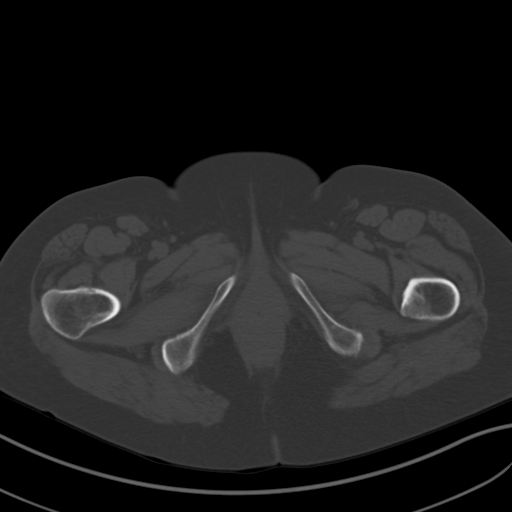
[im 15/90  soft-tissue]
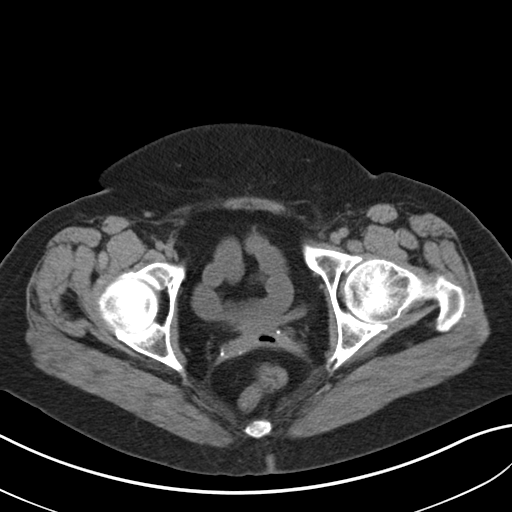
[im 20/90  soft-tissue]
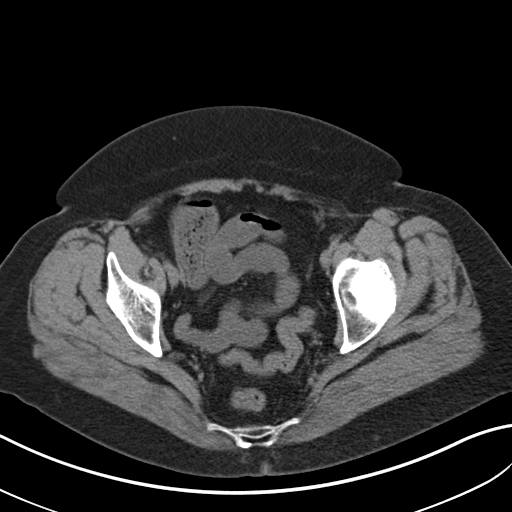
[im 25/90  soft-tissue]
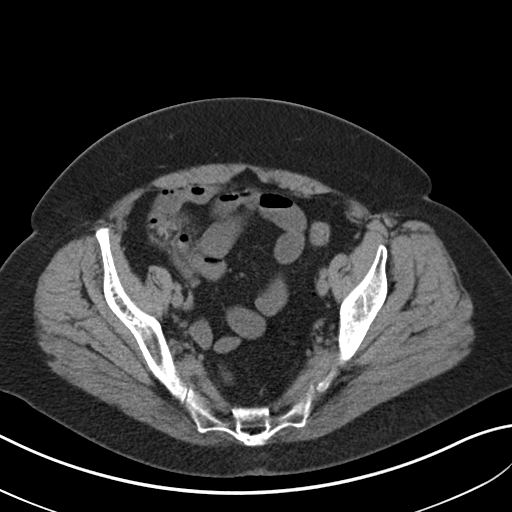
[im 35/90  soft-tissue]
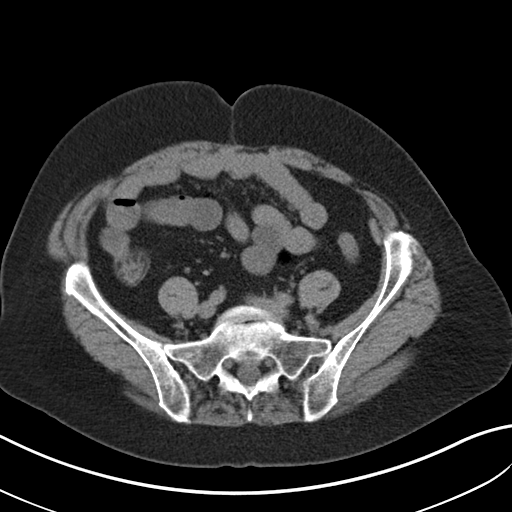
[im 40/90  soft-tissue]
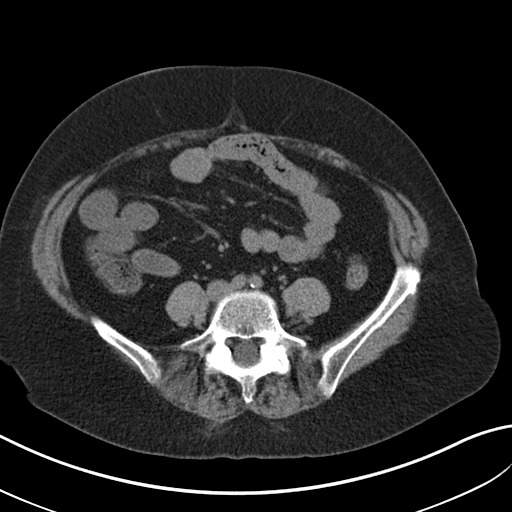
[im 50/90  soft-tissue]
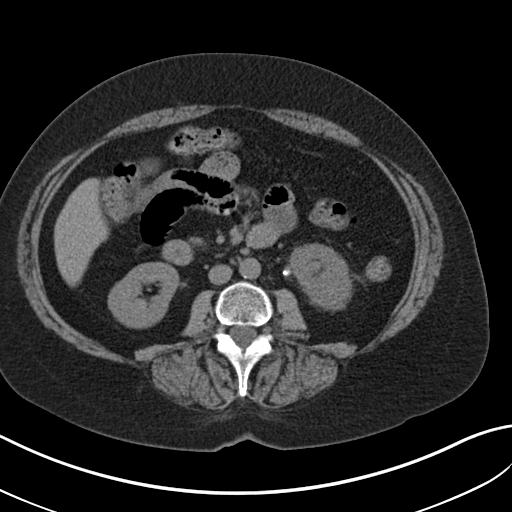
[im 55/90  soft-tissue]
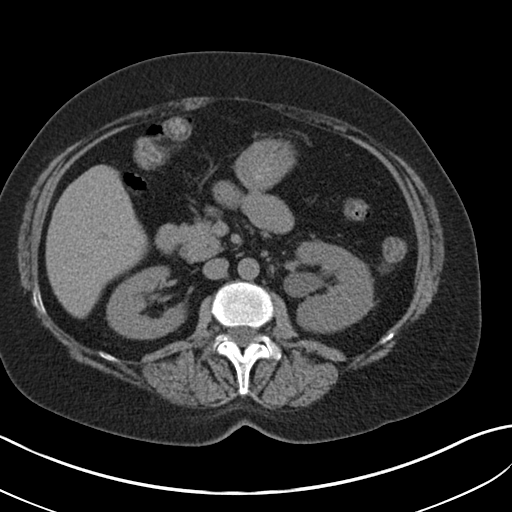
[im 65/90  soft-tissue]
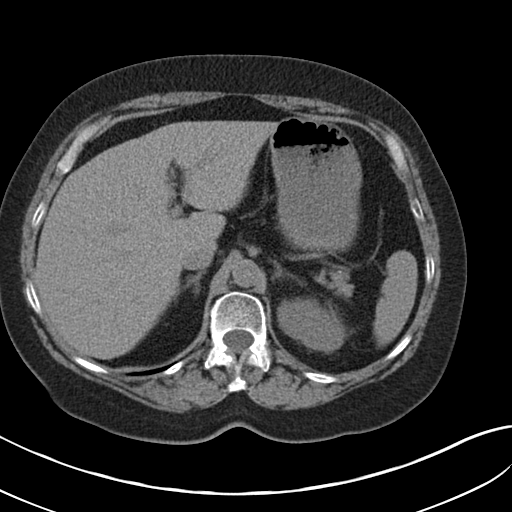
[im 65/90  bone]
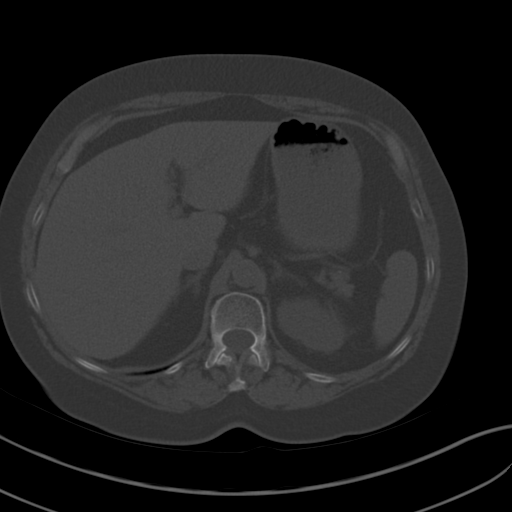
[im 70/90  soft-tissue]
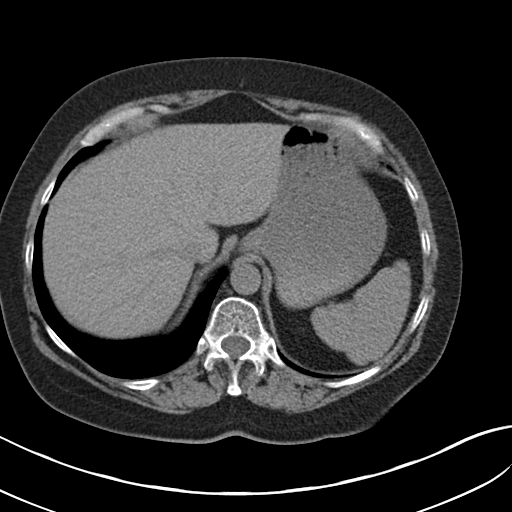
[im 75/90  soft-tissue]
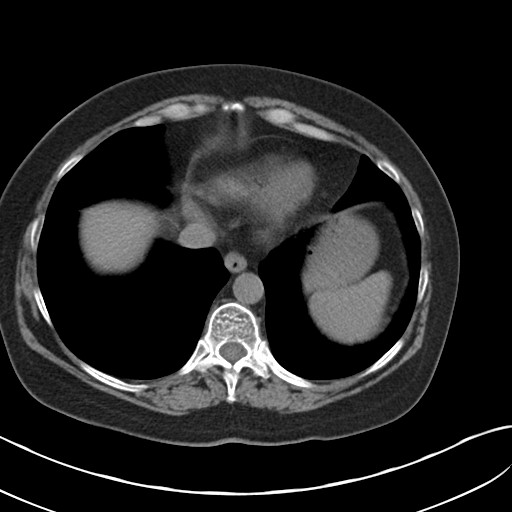
[im 85/90  soft-tissue]
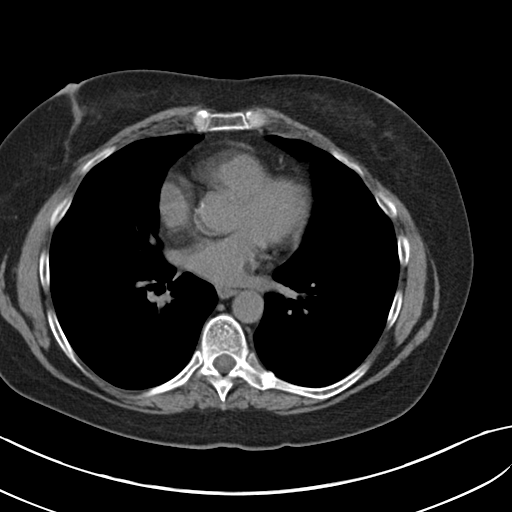

[Series 3: coronal · coronal · 0.66mm/px · 3 of 145 slices shown]
[im 49/145  soft-tissue]
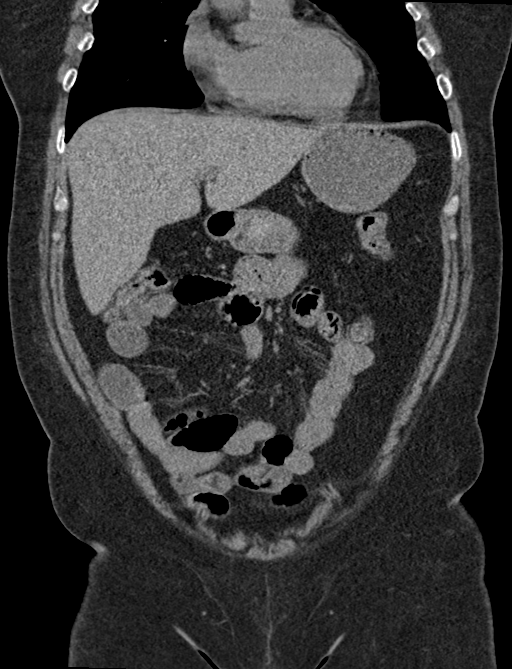
[im 65/145  soft-tissue]
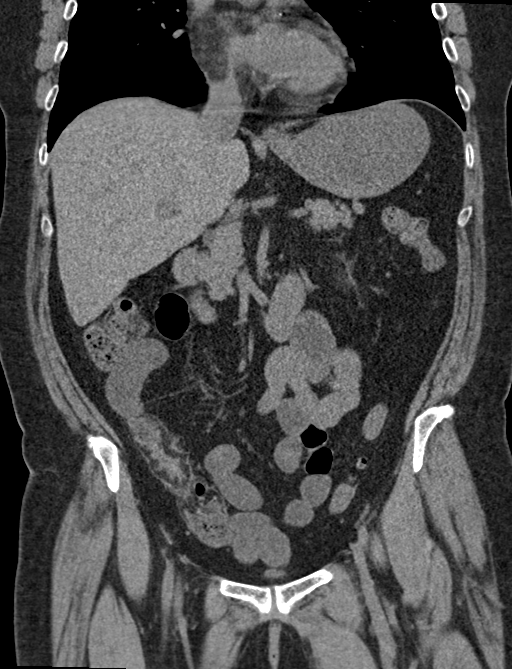
[im 81/145  soft-tissue]
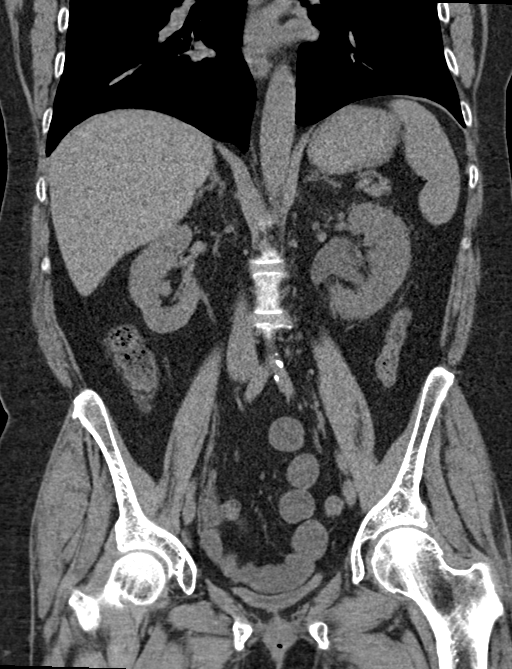

[15 of 46 positions shown; findings below may reference images not displayed]

FINDINGS: Lower chest: No acute abnormality.

Hepatobiliary: Unremarkable noncontrast appearance of the hepatic
parenchyma. Gallbladder surgically absent. No biliary ductal
dilation.

Pancreas: No pancreatic ductal dilation or evidence of acute
inflammation.

Spleen: No splenomegaly or focal splenic lesion.

Adrenals/Urinary Tract: Bilateral adrenal glands appear normal.

Edematous appearance of the left kidney with perinephric stranding
and hydronephrosis to the level of a few stacked stones at the
ureteropelvic junction measuring up to 3 mm.

No right-sided hydronephrosis. No additional nephrolithiasis
identified.

Urinary bladder is unremarkable for degree of distension.

Stomach/Bowel: Stomach is distended with ingested material without
focal wall thickening. No pathologic dilation of small or large
bowel. Normal appendix and terminal ileum. There is some mural fatty
infiltration along the proximal ascending colon, commonly reflecting
sequela of chronic inflammation. No evidence of acute bowel
inflammation. A few scattered left-sided colonic diverticula without
findings of acute diverticulitis.

Vascular/Lymphatic: Aortic and branch vessel atherosclerosis without
abdominal aortic aneurysm. No pathologically enlarged abdominal or
pelvic lymph nodes.

Reproductive: Status post hysterectomy. No adnexal masses.

Other: No significant abdominopelvic free fluid.

Musculoskeletal: L5-S1 discogenic disease.
IMPRESSION: 1. Obstructing 3 mm stones at the left ureteropelvic junction
resulting in mild to moderate left-sided hydronephrosis.
2. Colonic diverticulosis without findings of acute diverticulitis.
3.  Aortic Atherosclerosis ([id number]).

## 2022-08-12 IMAGING — DX DG CHEST 1V PORT
1 series · 1 of 1 positions shown · non-contrast
Comparison: 2828

CLINICAL DATA: Shortness of breath

EXAM:
PORTABLE CHEST 1 VIEW

[chest ap]
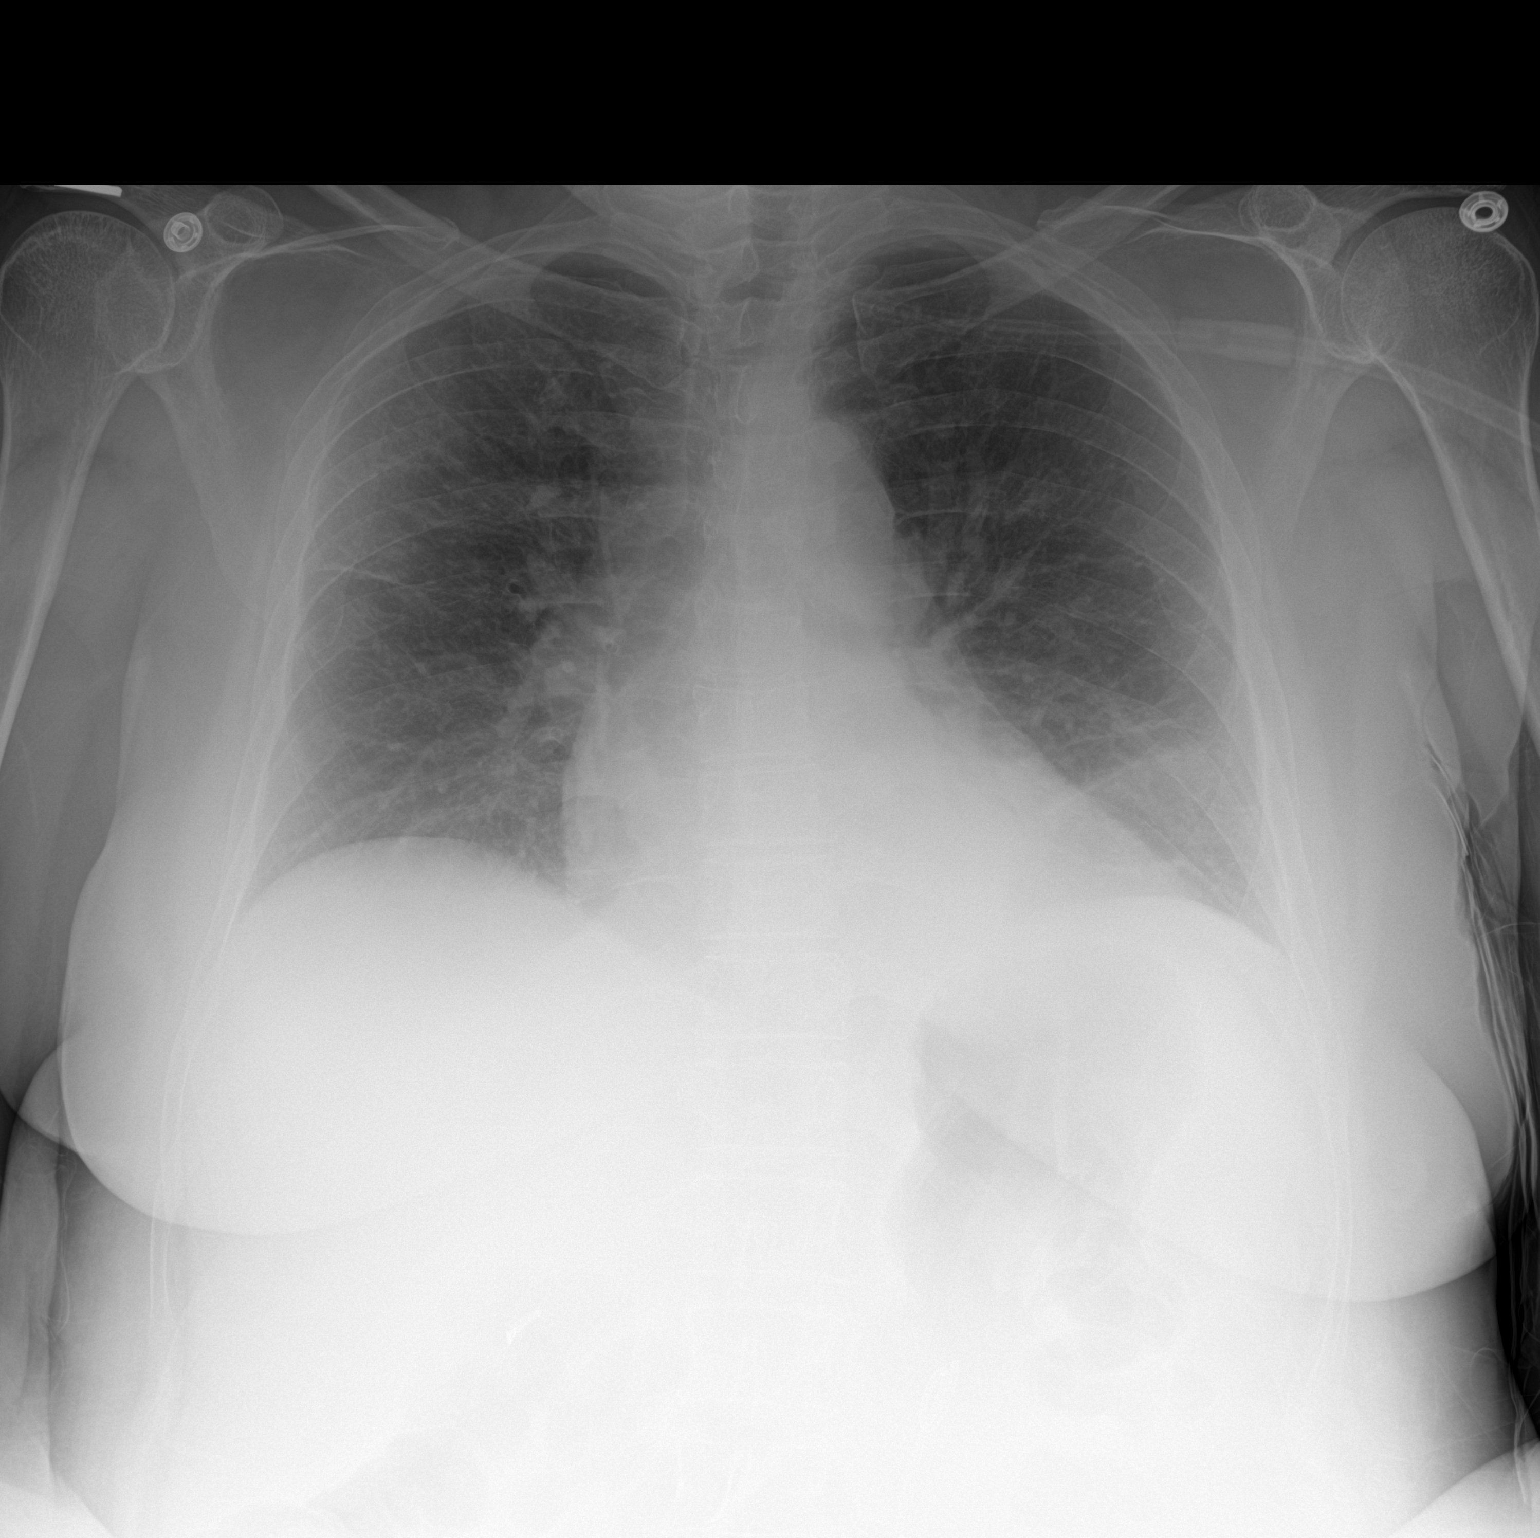

[1 of 1 positions shown; findings below may reference images not displayed]

FINDINGS: Shallow inspiration with low lung volumes. Linear
atelectasis/scarring bilaterally. No pleural effusion or
pneumothorax. Heart size for technique. No acute osseous
abnormality. Cholecystectomy clips.
IMPRESSION: No acute process in the chest.

## 2023-07-23 ENCOUNTER — Emergency Department: Payer: Self-pay

## 2023-07-23 ENCOUNTER — Emergency Department
Admission: EM | Admit: 2023-07-23 | Discharge: 2023-07-23 | Disposition: A | Discharge status: Home / Self Care | Payer: Self-pay | Attending: Emergency Medicine | Admitting: Emergency Medicine

## 2023-07-23 ENCOUNTER — Other Ambulatory Visit: Payer: Self-pay

## 2023-07-23 DIAGNOSIS — E860 Dehydration: Secondary | ICD-10-CM | POA: Insufficient documentation

## 2023-07-23 DIAGNOSIS — R0789 Other chest pain: Secondary | ICD-10-CM | POA: Insufficient documentation

## 2023-07-23 DIAGNOSIS — M545 Low back pain, unspecified: Secondary | ICD-10-CM | POA: Insufficient documentation

## 2023-07-23 DIAGNOSIS — R410 Disorientation, unspecified: Secondary | ICD-10-CM | POA: Insufficient documentation

## 2023-07-23 DIAGNOSIS — R109 Unspecified abdominal pain: Secondary | ICD-10-CM | POA: Insufficient documentation

## 2023-07-23 LAB — CBC
HCT: 33 % — ABNORMAL LOW (ref 36.0–46.0)
Hemoglobin: 10.4 g/dL — ABNORMAL LOW (ref 12.0–15.0)
MCH: 28.9 pg (ref 26.0–34.0)
MCHC: 31.5 g/dL (ref 30.0–36.0)
MCV: 91.7 fL (ref 80.0–100.0)
Platelets: 317 10*3/uL (ref 150–400)
RBC: 3.6 MIL/uL — ABNORMAL LOW (ref 3.87–5.11)
RDW: 13.3 % (ref 11.5–15.5)
WBC: 8.5 10*3/uL (ref 4.0–10.5)
nRBC: 0 % (ref 0.0–0.2)

## 2023-07-23 LAB — BASIC METABOLIC PANEL
Anion gap: 8 (ref 5–15)
BUN: 16 mg/dL (ref 6–20)
CO2: 26 mmol/L (ref 22–32)
Calcium: 8.8 mg/dL — ABNORMAL LOW (ref 8.9–10.3)
Chloride: 101 mmol/L (ref 98–111)
Creatinine, Ser: 0.96 mg/dL (ref 0.44–1.00)
GFR, Estimated: >60 mL/min (ref >60)
Glucose, Bld: 101 mg/dL — ABNORMAL HIGH (ref 70–99)
Potassium: 3.9 mmol/L (ref 3.5–5.1)
Sodium: 135 mmol/L (ref 135–145)

## 2023-07-23 LAB — URINALYSIS, ROUTINE W REFLEX MICROSCOPIC
Bacteria, UA: NONE SEEN
Bilirubin Urine: NEGATIVE
Glucose, UA: NEGATIVE mg/dL
Ketones, ur: NEGATIVE mg/dL
Leukocytes,Ua: NEGATIVE
Nitrite: NEGATIVE
Protein, ur: NEGATIVE mg/dL
Specific Gravity, Urine: 1.012 (ref 1.005–1.030)
pH: 6 (ref 5.0–8.0)

## 2023-07-23 LAB — LIPASE, BLOOD: Lipase: 33 U/L (ref 11–51)

## 2023-07-23 LAB — TROPONIN I (HIGH SENSITIVITY)
Troponin I (High Sensitivity): 3 ng/L (ref <18)
Troponin I (High Sensitivity): 5 ng/L (ref <18)

## 2023-07-23 MED ORDER — SODIUM CHLORIDE 0.9 % IV BOLUS
1000.0000 mL | Freq: Once | INTRAVENOUS | Status: AC
  Administered 2023-07-23: 1000 mL via INTRAVENOUS

## 2023-07-23 NOTE — ED Triage Notes (Signed)
Pt here with cp and flank pain. Pt states pain is centered and does not radiate. Pt also endorses left sided flank pain. Pt endorses nausea but denies diarrhea.

## 2023-07-23 NOTE — Discharge Instructions (Addendum)
Return to the ER for new, worsening, or persistent severe pain, lightheadedness, confusion, weakness or numbness, or any other new or worsening symptoms that concern you.

## 2023-07-23 NOTE — ED Provider Notes (Signed)
[facility] Provider Note    Event Date/Time   First MD Initiated Contact with Patient 07/23/23 1505     (approximate)   History   Chest Pain and Flank Pain   HPI  Karen Rhodes is a 63 y.o. female with a history of ureteral stones, anxiety, and depression who presents with multiple complaints.  The patient has left-sided flank and lower back pain which has been present intermittently for a few weeks and is a little bit worse over the last few days.  She also reports some chest discomfort which she describes as tightness which has been present for several days.  She denies any cough or significant shortness of breath.  Patient also reports feeling slightly confused which has also been intermittent for several days.  She noted yesterday that she could not understand something that was being told her that she should have been able to understand.  However today she states that this is feeling better.  She denies any vision changes, difficulty with speech, numbness or weakness in her arms or legs, or other acute neurologic symptoms.  She does feel quite dehydrated.  I reviewed the past medical records.  The patient was admitted to the hospitalist service in May 2023 after presenting with flank pain and difficulty urinating and ended up having a 3 mm obstructing UPJ stone.     Physical Exam   Triage Vital Signs: ED Triage Vitals  Encounter Vitals Group     BP 07/23/23 1208 134/67     Systolic BP Percentile --      Diastolic BP Percentile --      Pulse Rate 07/23/23 1208 83     Resp 07/23/23 1208 17     Temp 07/23/23 1208 97.9 F (36.6 C)     Temp Source 07/23/23 1208 Oral     SpO2 07/23/23 1208 96 %     Weight 07/23/23 1207 158 lb 15.2 oz (72.1 kg)     Height 07/23/23 1207 5\' 4"  (1.626 m)     Head Circumference --      Peak Flow --      Pain Score 07/23/23 1207 2     Pain Loc --      Pain Education --      Exclude from Growth Chart --     Most recent  vital signs: Vitals:   07/23/23 1208 07/23/23 1617  BP: 134/67   Pulse: 83   Resp: 17   Temp: 97.9 F (36.6 C) 97.9 F (36.6 C)  SpO2: 96%      General: Alert and oriented, comfortable appearing, no distress.  CV:  Good peripheral perfusion.  Resp:  Normal effort.  Lungs CTAB. Abd:  No distention.  Soft and nontender. Other:  No CVA tenderness.  Dry mucous membranes.  EOMI.  PERRLA.  No facial droop.  Normal speech.  Motor and sensory intact in all extremities.  No pronator drift.  No ataxia on finger-to-nose.   ED Results / Procedures / Treatments   Labs (all labs ordered are listed, but only abnormal results are displayed) Labs Reviewed  BASIC METABOLIC PANEL - Abnormal; Notable for the following components:      Result Value   Glucose, Bld 101 (*)    Calcium 8.8 (*)    All other components within normal limits  CBC - Abnormal; Notable for the following components:   RBC 3.60 (*)    Hemoglobin 10.4 (*)    HCT 33.0 (*)  All other components within normal limits  URINALYSIS, ROUTINE W REFLEX MICROSCOPIC - Abnormal; Notable for the following components:   Color, Urine STRAW (*)    APPearance CLEAR (*)    Hgb urine dipstick SMALL (*)    All other components within normal limits  LIPASE, BLOOD  TROPONIN I (HIGH SENSITIVITY)  TROPONIN I (HIGH SENSITIVITY)     EKG  ED ECG REPORT I, Dionne Bucy, the attending physician, personally viewed and interpreted this ECG.  Date: 07/23/2023 EKG Time: 1206 Rate: 77 Rhythm: normal sinus rhythm QRS Axis: normal Intervals: normal ST/T Wave abnormalities: Nonspecific ST abnormalities Narrative Interpretation: no evidence of acute ischemia   RADIOLOGY  Chest x-ray: I independently viewed and interpreted the images; there is no focal consolidation or edema   CT head: No acute abnormality  CT renal stone study: No ureteral stone or other acute findings  PROCEDURES:  Critical Care performed:  No  Procedures   MEDICATIONS ORDERED IN ED: Medications  sodium chloride 0.9 % bolus 1,000 mL (0 mLs Intravenous Stopped 07/23/23 1830)     IMPRESSION / MDM / ASSESSMENT AND PLAN / ED COURSE  I reviewed the triage vital signs and the nursing notes.  63 year old female with PMH as noted above presents with multiple complaints over several days to a few weeks, with intermittent left-sided flank pain, some chest discomfort, as well as lightheadedness and nonspecific neurologic symptoms.  The patient reports a feeling yesterday almost like a receptive aphasia although states that she has had this before and it is now resolved.  On exam she is well-appearing with normal vital signs.  Thorough neurologic exam is normal.  Physical exam is otherwise unremarkable.  Differential diagnosis includes, but is not limited to, ureteral stone, UTI/pyelonephritis, musculoskeletal pain, colitis, diverticulitis.  Differential for the chest pain includes acute bronchitis, musculoskeletal pain, GERD, anxiety, less likely ACS.  The patient reports lightheadedness as well as nonspecific symptoms of feeling confused or even possibly not understanding something that was being told to her, however she states that the symptoms have resolved.  Neurologic exam is normal.  I have a low suspicion for acute CVA/TIA or other acute CNS cause.  Initial lab workup is reassuring.  BMP shows no abnormalities.  CBC shows chronic mild EMEA.  Urinalysis is negative for acute findings.  Troponin is negative.  We will obtain a CT renal stone study, CT head, give fluids, and reassess.  Patient's presentation is most consistent with acute complicated illness / injury requiring diagnostic workup.  ----------------------------------------- 6:08 PM on 07/23/2023 -----------------------------------------  CT head and CT renal stone study are both negative for acute abnormality.  The patient is feeling better after fluids.  At this time she  has no significant symptoms.  She is stable for discharge home.  I counseled her on the results of the workup and plan of care.  I gave strict return precautions and she expressed understanding.   FINAL CLINICAL IMPRESSION(S) / ED DIAGNOSES   Final diagnoses:  Flank pain  Dehydration     Rx / DC Orders   ED Discharge Orders     None        Note:  This document was prepared using Dragon voice recognition software and may include unintentional dictation errors.    Dionne Bucy, MD 07/23/23 2321

## 2024-07-06 ENCOUNTER — Ambulatory Visit: Payer: Self-pay

## 2024-07-06 VITALS — BP 98/61 | HR 89 | Temp 98.3°F | Ht 64.0 in | Wt 170.1 lb

## 2024-07-06 DIAGNOSIS — G4700 Insomnia, unspecified: Secondary | ICD-10-CM

## 2024-07-06 DIAGNOSIS — E785 Hyperlipidemia, unspecified: Secondary | ICD-10-CM

## 2024-07-06 DIAGNOSIS — F329 Major depressive disorder, single episode, unspecified: Secondary | ICD-10-CM

## 2024-07-06 DIAGNOSIS — E559 Vitamin D deficiency, unspecified: Secondary | ICD-10-CM

## 2024-07-06 DIAGNOSIS — H539 Unspecified visual disturbance: Secondary | ICD-10-CM

## 2024-07-06 DIAGNOSIS — Z0000 Encounter for general adult medical examination without abnormal findings: Secondary | ICD-10-CM | POA: Insufficient documentation

## 2024-07-06 DIAGNOSIS — F909 Attention-deficit hyperactivity disorder, unspecified type: Secondary | ICD-10-CM

## 2024-07-06 DIAGNOSIS — Z1231 Encounter for screening mammogram for malignant neoplasm of breast: Secondary | ICD-10-CM

## 2024-07-06 DIAGNOSIS — Z122 Encounter for screening for malignant neoplasm of respiratory organs: Secondary | ICD-10-CM

## 2024-07-06 DIAGNOSIS — Z7689 Persons encountering health services in other specified circumstances: Secondary | ICD-10-CM

## 2024-07-06 DIAGNOSIS — Z1211 Encounter for screening for malignant neoplasm of colon: Secondary | ICD-10-CM

## 2024-07-06 DIAGNOSIS — Z13228 Encounter for screening for other metabolic disorders: Secondary | ICD-10-CM

## 2024-07-06 DIAGNOSIS — R7303 Prediabetes: Secondary | ICD-10-CM

## 2024-07-06 DIAGNOSIS — R002 Palpitations: Secondary | ICD-10-CM

## 2024-07-06 DIAGNOSIS — M797 Fibromyalgia: Secondary | ICD-10-CM

## 2024-07-06 DIAGNOSIS — Z1159 Encounter for screening for other viral diseases: Secondary | ICD-10-CM

## 2024-07-06 DIAGNOSIS — R079 Chest pain, unspecified: Secondary | ICD-10-CM

## 2024-07-06 MED ORDER — D3-50 1.25 MG (50000 UT) PO CAPS: 50000.0000 [IU] | ORAL_CAPSULE | ORAL | 0 refills | Status: AC

## 2024-07-06 NOTE — Assessment & Plan Note (Signed)
 Managed with Cymbalta  30 mg TID. Followed by psychiatry.

## 2024-07-06 NOTE — Assessment & Plan Note (Signed)
 Long-standing with statin intolerance due to muscle aches. Family history of heart disease. Discussed ezetimibe and injectable medications as alternatives. - Ordered fasting lipid panel. - Consider ezetimibe if cholesterol levels are not extremely high. - Discuss injectable medications if ezetimibe is insufficient.

## 2024-07-06 NOTE — Assessment & Plan Note (Signed)
 Managed by psychiatry. On adderall 30 mg BID.

## 2024-07-06 NOTE — Patient Instructions (Signed)
 VISIT SUMMARY: Today, you came in to establish care and discuss medication management. We reviewed your chronic conditions, including fibromyalgia, depression, anxiety, prediabetes, and hypercholesterolemia. We also discussed your recent symptoms and family history of health issues.  YOUR PLAN: HYPERCHOLESTEROLEMIA: You have long-standing high cholesterol and cannot tolerate statins due to muscle aches. -We ordered a fasting lipid panel to check your cholesterol levels. -If your cholesterol levels are not extremely high, we may consider starting you on ezetimibe. -If ezetimibe is not sufficient, we will discuss injectable medications.  PREDIABETES: You were diagnosed with prediabetes 3-4 years ago. -We ordered fasting blood work, including an A1c test, to check your blood sugar levels.  FIBROMYALGIA: You have chronic fibromyalgia with increased pain, especially in your shoulders. -Continue managing your pain through physical activity as you have concerns about medication interactions.  DEPRESSION AND ANXIETY: You have long-standing depression and anxiety, managed by Dr. Kirt. -Continue taking your current psychiatric medications as prescribed by Dr. Kirt.  INSOMNIA: You have chronic insomnia managed with trazodone and occasional zolpidem . -Continue taking trazodone and zolpidem  as needed.  VITAMIN D DEFICIENCY: You are managing a vitamin D deficiency with high-dose supplements. -We ordered a vitamin D level test. -Your vitamin D prescription has been refilled.  GENERAL HEALTH MAINTENANCE: We discussed routine health maintenance, including your history of smoking and family history of colon cancer. -We ordered a lung cancer screening CT scan. -We ordered a mammogram. -We ordered a colonoscopy. -We referred you to an eye doctor for your vision issues. -We discussed the tetanus vaccination, and you are considering it.  If you have any problems before your next visit feel free to message  me via MyChart (minor issues or questions) or call the office, otherwise you may reach out to schedule an office visit.  Thank you! Saddie Sacks, PA-C

## 2024-07-06 NOTE — Progress Notes (Signed)
 "  New Patient Office Visit  Subjective    Patient ID: Karen Rhodes, female    DOB: [birth year]  Age: 64 y.o. MRN: [id number]  CC:  Chief Complaint  Patient presents with   New Patient (Initial Visit)    Establish Care    Discussed the use of AI scribe software for clinical note transcription with the patient, who gave verbal consent to proceed.  History of Present Illness   Karen Rhodes is a 64 year old female who presents for establishing care and medication management. She has not been seen by primary care in several years.   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 Follows with Dr. Vincente in [place] who is her psychiatrist that she sees every 6 months.  - Current medications: Adderall 20 mg three times daily, duloxetine  30 mg three times daily, and Ambien  10 mg at bedtime. Sometimes will take trazodone 50 mg if she wakes up in the night and can't go back to sleep.   Metabolic and cardiovascular risk factors - Diagnosed with prediabetes and hyperlipidemia 3-4 years ago - Discontinued statin therapy due to severe muscle aches - No cholesterol medication for over 30 years, attempts dietary management - Family history of heart disease and high cholesterol - Is open to other medications to treat cholesterol   Tobacco and ETOH intake  - History of smoking for 42 years, quit after sepsis episode - Current alcohol use: approximately one standard drink per night (smirnoff grape)   Dizziness, chest symptoms, and palpitations - Episodes of dizziness, chest tightness, and shortness of  breath, thinks these symptoms may be associated with anxiety - Underwent chemically induced stress test in the past but unsure of the results as it was so long ago.  - No identifiable triggers of the chest pain. They occur randomly a few times per month and happen during and not during physical activity. Rest sometimes relieves the symptoms, other times they will just go away on their own   Visual and auditory disturbances - Vision problems - Recent episode of near-syncope at work, attributed to possible vertigo - Tinnitus described as buzzing in the ears  Gynecologic and surgical history - Total hysterectomy in late 82s with one ovary remaining - History of cholecystectomy  Visual Disturbance  - Also notes gradual worsening in her vision. Has not had an eye exam  - Denies red flag sx such as floaters, flashes of light, black-outs, etc.   Family history of malignancy - Family history of colon cancer; sister recently cleared after treatment - She has had one colonoscopy before but it was > 10 years ago; results unsure   Supplement use - Vitamin D supplementation: 50,000 units twice weekly      Screenings:  Colon Cancer: Due; ordered today Lung Cancer: Due: Smoking history of 1 ppd x 42 years. Quit almost 2 years ago. Order placed today Breast Cancer: Due: order placed today Cervical Cancer: Hx of total hysterectomy Diabetes: Checking A1c with labs HLD: Checking lipid panel with labs The ASCVD Risk score (Arnett DK, et al., 2019) failed to calculate for the following reasons:   Cannot find a previous HDL  lab   Cannot find a previous total cholesterol lab   * - Cholesterol units were assumed  Outpatient Encounter Medications as of 07/06/2024  Medication Sig   amphetamine-dextroamphetamine (ADDERALL) 30 MG tablet Take 30 mg by mouth 2 (two) times daily.   D3-50 1.25 MG (50000 UT) capsule Take 1 capsule (50,000 Units total) by mouth every Wednesday.   DULoxetine  (CYMBALTA ) 30 MG  capsule Take 30 mg by mouth 3 (three) times daily.    traZODone (DESYREL) 50 MG tablet Take 50 mg by mouth See admin instructions. Take 1 tablet (50 mg) by mouth at night if awaken during the night to return to sleep.   zolpidem  (AMBIEN ) 10 MG tablet Take 10 mg by mouth at bedtime.   [DISCONTINUED] Cyanocobalamin (VITAMIN B-12 PO) Take 1 tablet by mouth 3 (three) times a week. (Patient not taking: Reported on 07/06/2024)   [DISCONTINUED] D3-50 1.25 MG (50000 UT) capsule Take 50,000 Units by mouth every Wednesday.   [DISCONTINUED] ELDERBERRY PO Take 1 capsule by mouth 3 (three) times a week.   [DISCONTINUED] Multiple Vitamins-Minerals (ZINC PO) Take 1 tablet by mouth 3 (three) times a week.   [DISCONTINUED] nicotine  (NICODERM CQ  - DOSED IN MG/24 HOURS) 21 mg/24hr patch Place 1 patch (21 mg total) onto the skin daily. (Patient not taking: Reported on 07/06/2024)   [DISCONTINUED] omeprazole (PRILOSEC) 20 MG capsule Take 20 mg by mouth daily as needed (indigestion/heartburn/ulcer issues.).   [DISCONTINUED] ondansetron  (ZOFRAN ) 4 MG tablet Take 1 tablet (4 mg total) by mouth every 8 (eight) hours as needed for nausea or vomiting.   [DISCONTINUED] sulfamethoxazole -trimethoprim  (BACTRIM  DS) 800-160 MG tablet Take 1 tablet by mouth 2 (two) times daily.   [DISCONTINUED] traMADol (ULTRAM) 50 MG tablet Take 50 mg by mouth every 4 (four) hours as needed for pain.   No facility-administered encounter medications on file as of 07/06/2024.    Past Medical History:  Diagnosis Date   Allergy    Anxiety    Depression    History of kidney stones    Left ureteral stone    Sepsis (HCC) 10/24/2021   Ulcer     Past Surgical History:  Procedure Laterality Date   ABDOMINAL HYSTERECTOMY     CHOLECYSTECTOMY     CYSTOSCOPY WITH URETEROSCOPY AND STENT PLACEMENT Left 10/24/2021   Procedure: CYSTOSCOPY WITh retrograde AND STENT PLACEMENT;  Surgeon: Matilda Senior, MD;  Location: [facility];  Service: Urology;   Laterality: Left;   CYSTOSCOPY/URETEROSCOPY/HOLMIUM LASER/STENT PLACEMENT Left 11/14/2021   Procedure: CYSTOSCOPY/URETEROSCOPY/STONE EXTRACTION/STENT EXCHANGE;  Surgeon: Matilda Senior, MD;  Location: [facility];  Service: Urology;  Laterality: Left;  60 MINUTES   TUBAL LIGATION      Family History  Problem Relation Age of Onset   Heart disease Mother    Cancer Mother    Depression Sister    Heart disease Maternal Grandmother    Dementia Paternal Grandmother     Social History   Socioeconomic History   Marital status: Married    Spouse name: Lanah Steines   Number of children: 3   Years of education: Not on file   Highest education level: 12th grade  Occupational History   Not on file  Tobacco Use   Smoking status: Former    Types: Cigarettes   Smokeless tobacco: Never   Tobacco comments:    Has not smoked in 3 weeks  Vaping Use   Vaping status: Never Used  Substance and Sexual Activity   Alcohol use: Yes  Comment: occ   Drug use: No   Sexual activity: Yes    Birth control/protection: None  Other Topics Concern   Not on file  Social History Narrative   Caffeine Intake: _2__   cups per day   Weight:    Are you satisfied with your weight?No   Diet: How do you rate your diet?Poor   Do you eat or drink 4 servings of daily or soy daily or take calcium  supplements?NO   Exercise:    Do you exercise regularly?No   What type of exercise?   How long(minutes)?   How often?    Safety:    Do you wear a bike helmet? N?A   Do you use your seatbelts constantly?Yes   Is violence at home a concern for you?No   Have you ever been abused?No   Do you have a gun in your home?Yes   Social Drivers of Health   Tobacco Use: Medium Risk (07/06/2024)   Patient History    Smoking Tobacco Use: Former    Smokeless Tobacco Use: Never    Passive Exposure: Not on Actuary Strain: Not on file  Food Insecurity: Not on file  Transportation Needs: Not on file  Physical  Activity: Not on file  Stress: Not on file  Social Connections: Not on file  Intimate Partner Violence: Not on file  Depression (PHQ2-9): High Risk (07/06/2024)   Depression (PHQ2-9)    PHQ-2 Score: 14  Alcohol Screen: Not on file  Housing: Not on file  Utilities: Not on file  Health Literacy: Not on file    ROS  Per HPI      Objective    BP 98/61   Pulse 89   Temp 98.3 F (36.8 C) (Oral)   Ht 5' 4 (1.626 m)   Wt 170 lb 1.9 oz (77.2 kg)   SpO2 99%   BMI 29.20 kg/m   Physical Exam Constitutional:      General: She is not in acute distress.    Appearance: Normal appearance.  Eyes:     Pupils: Pupils are equal, round, and reactive to light.  Cardiovascular:     Rate and Rhythm: Normal rate and regular rhythm.     Heart sounds: Normal heart sounds. No murmur heard.    No friction rub. No gallop.  Pulmonary:     Effort: Pulmonary effort is normal. No respiratory distress.     Breath sounds: Normal breath sounds.  Abdominal:     General: Bowel sounds are normal.  Musculoskeletal:        General: No swelling.     Cervical back: Neck supple.  Lymphadenopathy:     Cervical: No cervical adenopathy.  Skin:    General: Skin is warm and dry.  Neurological:     General: No focal deficit present.     Mental Status: She is alert.  Psychiatric:        Mood and Affect: Mood normal.        Behavior: Behavior normal.        Thought Content: Thought content normal.         Assessment & Plan:   Screen for colon cancer -     Ambulatory referral to Gastroenterology  Screening for lung cancer -     CT CHEST LUNG CANCER SCREENING LOW DOSE WO CONTRAST; Future  Screening mammogram for breast cancer -     3D Screening Mammogram, Left and Right; Future  Screening for endocrine,  nutritional, metabolic and immunity disorder -     VITAMIN D 25 Hydroxy (Vit-D Deficiency, Fractures); Future -     TSH; Future -     Hemoglobin A1c; Future -     Lipid panel; Future -      Comprehensive metabolic panel with GFR; Future -     CBC with Differential/Platelet; Future  Screening for viral disease -     Hepatitis C antibody; Future  Palpitations -     EKG 12-Lead  Vision disturbance -     Ambulatory referral to Ophthalmology  Chest pain, unspecified type Assessment & Plan: Chest pain with symptoms that may be consistent with unstable angina.  Updating a lipid panel today to help further stratify ASCVD risk. EKG in office today normal. Referral to cardiology placed for further evaluation and likely stress testing. Advised to go to ER for any persistent crushing chest pain, SOB, jaw pain, neurologic deficits, etc. Patient verbalized understanding and was in agreement with the plan.   Orders: -     Ambulatory referral to Cardiology  Hyperlipidemia, unspecified hyperlipidemia type Assessment & Plan: Long-standing with statin intolerance due to muscle aches. Family history of heart disease. Discussed ezetimibe and injectable medications as alternatives. - Ordered fasting lipid panel. - Consider ezetimibe if cholesterol levels are not extremely high. - Discuss injectable medications if ezetimibe is insufficient.   Prediabetes Assessment & Plan: Diagnosed 3-4 years ago. No recent blood work. - Ordered fasting blood work including A1c.   Fibromyalgia Assessment & Plan: Managed with Cymbalta  30 mg TID. Followed by psychiatry.    Current episode of major depressive disorder without prior episode, unspecified depression episode severity Assessment & Plan: Followed by psychiatry, Dr. Vincente. Continue Cymbalta  30 mg TID.   Attention deficit hyperactivity disorder (ADHD), unspecified ADHD type Assessment & Plan: Managed by psychiatry. On adderall 30 mg BID.    Insomnia, unspecified type Assessment & Plan: Managed by psychiatry. Continue Zolpidem  10 mg nightly and trazodone prn when waking up in the night.    Vitamin D deficiency Assessment &  Plan: Rechecking levels today to determine length of need for vitamin D supplement. Psychiatry previous prescribed high dose vitamin D twice per week. Advised that if level is too high, we will decrease dose of vitamin D to prevent further side effects.    Healthcare maintenance Assessment & Plan: Routine health maintenance discussed. History of smoking and family history of colon cancer noted. Vision issues require evaluation. - Ordered lung cancer screening CT scan. - Ordered mammogram. - Ordered colonoscopy. - Pap not indicated due to hx of total hysterectomy - Referred to eye doctor - [facility] - Discussed tetanus vaccination; she is considering but declined today    Other orders -     D3-50; Take 1 capsule (50,000 Units total) by mouth every Wednesday.  Dispense: 12 capsule; Refill: 0    Return in about 3 months (around 10/04/2024) for HLD, pre-DM.   Saddie JULIANNA Sacks, PA-C  "

## 2024-07-06 NOTE — Assessment & Plan Note (Signed)
 Rechecking levels today to determine length of need for vitamin D supplement. Psychiatry previous prescribed high dose vitamin D twice per week. Advised that if level is too high, we will decrease dose of vitamin D to prevent further side effects.

## 2024-07-06 NOTE — Assessment & Plan Note (Signed)
 Followed by psychiatry, Dr. Vincente. Continue Cymbalta  30 mg TID.

## 2024-07-06 NOTE — Assessment & Plan Note (Signed)
 Diagnosed 3-4 years ago. No recent blood work. - Ordered fasting blood work including A1c.

## 2024-07-06 NOTE — Assessment & Plan Note (Signed)
 Routine health maintenance discussed. History of smoking and family history of colon cancer noted. Vision issues require evaluation. - Ordered lung cancer screening CT scan. - Ordered mammogram. - Ordered colonoscopy. - Pap not indicated due to hx of total hysterectomy - Referred to eye doctor - [facility] - Discussed tetanus vaccination; she is considering but declined today

## 2024-07-06 NOTE — Assessment & Plan Note (Signed)
 Managed by psychiatry. Continue Zolpidem  10 mg nightly and trazodone prn when waking up in the night.

## 2024-07-06 NOTE — Assessment & Plan Note (Signed)
 Chest pain with symptoms that may be consistent with unstable angina.  Updating a lipid panel today to help further stratify ASCVD risk. EKG in office today normal. Referral to cardiology placed for further evaluation and likely stress testing. Advised to go to ER for any persistent crushing chest pain, SOB, jaw pain, neurologic deficits, etc. Patient verbalized understanding and was in agreement with the plan.

## 2024-08-02 ENCOUNTER — Ambulatory Visit: Payer: Self-pay

## 2024-08-23 ENCOUNTER — Ambulatory Visit

## 2024-09-29 ENCOUNTER — Other Ambulatory Visit: Payer: Self-pay

## 2024-10-06 ENCOUNTER — Ambulatory Visit: Payer: Self-pay
